# Patient Record
Sex: Female | Born: 1995 | Race: White | Hispanic: No | Marital: Married | Smoking: Never smoker
Health system: Southern US, Community
[De-identification: ages and names within clinical notes are randomized; demographics above are authoritative.]

## PROBLEM LIST (undated history)

## (undated) DIAGNOSIS — F419 Anxiety disorder, unspecified: Secondary | ICD-10-CM

## (undated) DIAGNOSIS — N200 Calculus of kidney: Secondary | ICD-10-CM

## (undated) DIAGNOSIS — F988 Other specified behavioral and emotional disorders with onset usually occurring in childhood and adolescence: Secondary | ICD-10-CM

## (undated) DIAGNOSIS — Z8619 Personal history of other infectious and parasitic diseases: Secondary | ICD-10-CM

## (undated) DIAGNOSIS — R87619 Unspecified abnormal cytological findings in specimens from cervix uteri: Secondary | ICD-10-CM

## (undated) HISTORY — DX: Personal history of other infectious and parasitic diseases: Z86.19

## (undated) HISTORY — DX: Calculus of kidney: N20.0

## (undated) HISTORY — PX: WISDOM TOOTH EXTRACTION: SHX21

## (undated) HISTORY — DX: Other specified behavioral and emotional disorders with onset usually occurring in childhood and adolescence: F98.8

## (undated) HISTORY — DX: Unspecified abnormal cytological findings in specimens from cervix uteri: R87.619

---
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
## 2014-12-09 ENCOUNTER — Other Ambulatory Visit: Payer: Self-pay | Admitting: Obstetrics & Gynecology

## 2014-12-09 NOTE — Telephone Encounter (Signed)
Needs appointment for annual examination.  I will refill one month of her birth control pills.

## 2014-12-09 NOTE — Telephone Encounter (Signed)
Patient called back and left a message on the answering machine at lunch. °

## 2014-12-09 NOTE — Telephone Encounter (Addendum)
Called patient she states she has not been taking Rx for the past 3 months and she wanted to get back on Rx. Inform pt she will need to be seen and also have her AEX. Pt states she is in school in [place] and does not know when she is coming back to town.   -Advised pt she can see somebody else in [place] and get her Rx refill there and call back here to schedule appt when she is ready in the summer.   Patient verbalized understanding.  She will go to the health center at her school.   Dr. Edward JollySilva approved 1 month. [facility] -Encounter closed.

## 2014-12-09 NOTE — Telephone Encounter (Signed)
Returned call. Phone number updated. °

## 2014-12-09 NOTE — Telephone Encounter (Signed)
Medication refill request: Yaz  Last AEX:  08/25/13 SM Next AEX: not scheduled  Last MMG (if hormonal medication request): none Refill authorized: 08/25/13 #4packs/4Refills/ Today #1pack with note "Will need OV for further refills"?  Attempted to call patient. Mailbox full. Unable to leave voicemail.   Routed to Dr. Edward JollySilva. Dr. Hyacinth MeekerMiller is out of the office today.

## 2015-01-09 ENCOUNTER — Other Ambulatory Visit: Payer: Self-pay | Admitting: Obstetrics and Gynecology

## 2015-01-11 NOTE — Telephone Encounter (Signed)
Medication refill request: Yaz Last AEX:  08/25/13 SM Next AEX: not scheduled  Last MMG (if hormonal medication request): none Refill authorized: 12/09/14 #1pack/0R.  Rx Denied.   Mackenzie SallesBrook E Amundson C Silva, MD at 12/09/2014 3:17 PM     Status: Signed       Expand All Collapse All   Needs appointment for annual examination.  I will refill one month of her birth control pills.

## 2018-01-02 ENCOUNTER — Encounter: Payer: Self-pay | Admitting: Obstetrics and Gynecology

## 2018-01-02 ENCOUNTER — Other Ambulatory Visit: Payer: Self-pay

## 2018-01-02 ENCOUNTER — Ambulatory Visit (INDEPENDENT_AMBULATORY_CARE_PROVIDER_SITE_OTHER): Payer: 59 | Admitting: Obstetrics and Gynecology

## 2018-01-02 ENCOUNTER — Other Ambulatory Visit (HOSPITAL_COMMUNITY)
Admission: RE | Admit: 2018-01-02 | Discharge: 2018-01-02 | Disposition: A | Discharge status: Home / Self Care | Payer: 59 | Source: Ambulatory Visit | Attending: Obstetrics and Gynecology | Admitting: Obstetrics and Gynecology

## 2018-01-02 VITALS — BP 120/70 | HR 80 | Resp 12 | Ht 63.25 in | Wt 127.0 lb

## 2018-01-02 DIAGNOSIS — F419 Anxiety disorder, unspecified: Secondary | ICD-10-CM | POA: Diagnosis not present

## 2018-01-02 DIAGNOSIS — Z0000 Encounter for general adult medical examination without abnormal findings: Secondary | ICD-10-CM

## 2018-01-02 DIAGNOSIS — R87611 Atypical squamous cells cannot exclude high grade squamous intraepithelial lesion on cytologic smear of cervix (ASC-H): Secondary | ICD-10-CM | POA: Insufficient documentation

## 2018-01-02 DIAGNOSIS — Z113 Encounter for screening for infections with a predominantly sexual mode of transmission: Secondary | ICD-10-CM | POA: Diagnosis not present

## 2018-01-02 DIAGNOSIS — Z01419 Encounter for gynecological examination (general) (routine) without abnormal findings: Secondary | ICD-10-CM

## 2018-01-02 DIAGNOSIS — Z124 Encounter for screening for malignant neoplasm of cervix: Secondary | ICD-10-CM | POA: Diagnosis present

## 2018-01-02 DIAGNOSIS — N920 Excessive and frequent menstruation with regular cycle: Secondary | ICD-10-CM

## 2018-01-02 DIAGNOSIS — N946 Dysmenorrhea, unspecified: Secondary | ICD-10-CM | POA: Diagnosis not present

## 2018-01-02 LAB — POCT URINE PREGNANCY: Preg Test, Ur: NEGATIVE

## 2018-01-02 MED ORDER — CITALOPRAM HYDROBROMIDE 20 MG PO TABS: ORAL_TABLET | ORAL | 1 refills | Status: DC

## 2018-01-02 MED ORDER — NORETHIN ACE-ETH ESTRAD-FE 1-20 MG-MCG PO TABS: 1.0000 | ORAL_TABLET | Freq: Every day | ORAL | 0 refills | Status: DC

## 2018-01-02 MED ORDER — NAPROXEN SODIUM 550 MG PO TABS: ORAL_TABLET | ORAL | 2 refills | Status: DC

## 2018-01-02 NOTE — Progress Notes (Signed)
22 y.o. G0P0000 SingleCaucasianF here for annual exam. Patient is c/o severe dysmenorrhea Period Cycle (Days): 28 Period Duration (Days): 7 days  Period Pattern: Regular Menstrual Flow: Heavy, Moderate Menstrual Control: Tampon, Maxi pad, Thin pad Menstrual Control Change Freq (Hours): changes pad and tampon every 1-2 hours on heavy days Dysmenorrhea: (!) Severe Dysmenorrhea Symptoms: Cramping, Nausea, Headache  Over the last 6+ months her cycles have gotten so painful and heavy. She gets headaches, nausea, diarrhea. Sometimes feels she is going to pass out because she feels so bad. She can saturate a super tampon in up to 1 hour. She bleeds through her pad at night.  She was on Yaz in the past.    Sexually active, same partner x 3 years. They are planning on getting married, trying to not be sexually active until they get married.  She has deep dyspareunia, almost every time, only lasts a few seconds, positional.   She c/o worsening anxiety. Can't be home alone. Occasional panic attacks.   Patient's last menstrual period was 12/31/2017.          Sexually active: Yes.    The current method of family planning is none.    Exercising: Yes.    circuits/ cardio  Smoker:  no  Health Maintenance: Pap:  Never TDaP: unsure, check records.  Gardasil: no   reports that she has never smoked. She has never used smokeless tobacco. She reports that she drinks about 1.8 oz of alcohol per week. She reports that she does not use drugs. Works as a Herbalist.   Past Medical History:  Diagnosis Date  . ADD (attention deficit disorder)   . History of mononucleosis 12/13    History reviewed. No pertinent surgical history.  No current outpatient medications on file.   No current facility-administered medications for this visit.     Family History  Problem Relation Age of Onset  . Multiple myeloma Maternal Grandmother   . Heart disease Maternal Grandfather        heart stents  . Graves'  disease Mother     Review of Systems  Constitutional: Negative.   HENT: Negative.   Eyes: Negative.   Respiratory: Negative.   Cardiovascular: Negative.   Gastrointestinal: Negative.   Endocrine: Negative.   Genitourinary: Positive for menstrual problem.       Dysmenorrhea with nausea  Musculoskeletal: Negative.   Skin: Negative.   Allergic/Immunologic: Negative.   Neurological: Positive for headaches.  Psychiatric/Behavioral: Negative.     Exam:   BP 120/70 (BP Location: Right Arm, Patient Position: Sitting, Cuff Size: Normal)   Pulse 80   Resp 12   Ht 5' 3.25" (1.607 m)   Wt 127 lb (57.6 kg)   LMP 12/31/2017   BMI 22.32 kg/m   Weight change: _0 @ Height:   Height: 5' 3.25" (160.7 cm)  Ht Readings from Last 3 Encounters:  01/02/18 5' 3.25" (1.607 m)  08/25/13 5' 3.5" (1.613 m) (38 %, Z= -0.30)*   * Growth percentiles are based on CDC (Girls, 2-20 Years) data.    General appearance: alert, cooperative and appears stated age Head: Normocephalic, without obvious abnormality, atraumatic Neck: no adenopathy, supple, symmetrical, trachea midline and thyroid normal to inspection and palpation Lungs: clear to auscultation bilaterally Cardiovascular: regular rate and rhythm Breasts: normal appearance, no masses or tenderness Abdomen: soft, non-tender; non distended,  no masses,  no organomegaly Extremities: extremities normal, atraumatic, no cyanosis or edema Skin: Skin color, texture, turgor normal. No rashes or lesions  Lymph nodes: Cervical, supraclavicular, and axillary nodes normal. No abnormal inguinal nodes palpated Neurologic: Grossly normal She thinks she has IBS, but her anxiety affects her as well.   Pelvic: External genitalia:  no lesions              Urethra:  normal appearing urethra with no masses, tenderness or lesions              Bartholins and Skenes: normal                 Vagina: normal appearing vagina with normal color and discharge, no  lesions              Cervix: no lesions               Bimanual Exam:  Uterus:  normal size, contour, position, consistency, mobility, non-tender, anteverted              Adnexa: no mass, fullness, tenderness               Rectovaginal: Confirms               Anus:  normal sphincter tone, no lesions  Chaperone was present for exam.  A:  Well Woman with normal exam  Menorrhagia  Severe dysmenorrhea  Anxiety, worsening    P:   Pap with hpv  Start OCP's, no contraindication, risks reviewed  Screening STD  Screening labs  Discussed breast self exam  Discussed calcium and vit D intake  Discussed options of counseling, or trying medication for anxiety, she would like to try medication.   She will check if she has had her TDAP  Information on gardasil given and reviewed

## 2018-01-02 NOTE — Patient Instructions (Addendum)
Check if you have had a TDAP (tetanus) in the last 10 years.  Breast Self-Awareness Breast self-awareness means being familiar with how your breasts look and feel. It involves checking your breasts regularly and reporting any changes to your health care provider. Practicing breast self-awareness is important. A change in your breasts can be a sign of a serious medical problem. Being familiar with how your breasts look and feel allows you to find any problems early, when treatment is more likely to be successful. All women should practice breast self-awareness, including women who have had breast implants. How to do a breast self-exam One way to learn what is normal for your breasts and whether your breasts are changing is to do a breast self-exam. To do a breast self-exam: Look for Changes  1. Remove all the clothing above your waist. 2. Stand in front of a mirror in a room with good lighting. 3. Put your hands on your hips. 4. Push your hands firmly downward. 5. Compare your breasts in the mirror. Look for differences between them (asymmetry), such as: ? Differences in shape. ? Differences in size. ? Puckers, dips, and bumps in one breast and not the other. 6. Look at each breast for changes in your skin, such as: ? Redness. ? Scaly areas. 7. Look for changes in your nipples, such as: ? Discharge. ? Bleeding. ? Dimpling. ? Redness. ? A change in position. Feel for Changes  Carefully feel your breasts for lumps and changes. It is best to do this while lying on your back on the floor and again while sitting or standing in the shower or tub with soapy water on your skin. Feel each breast in the following way:  Place the arm on the side of the breast you are examining above your head.  Feel your breast with the other hand.  Start in the nipple area and make  inch (2 cm) overlapping circles to feel your breast. Use the pads of your three middle fingers to do this. Apply light pressure,  then medium pressure, then firm pressure. The light pressure will allow you to feel the tissue closest to the skin. The medium pressure will allow you to feel the tissue that is a little deeper. The firm pressure will allow you to feel the tissue close to the ribs.  Continue the overlapping circles, moving downward over the breast until you feel your ribs below your breast.  Move one finger-width toward the center of the body. Continue to use the  inch (2 cm) overlapping circles to feel your breast as you move slowly up toward your collarbone.  Continue the up and down exam using all three pressures until you reach your armpit.  Write Down What You Find  Write down what is normal for each breast and any changes that you find. Keep a written record with breast changes or normal findings for each breast. By writing this information down, you do not need to depend only on memory for size, tenderness, or location. Write down where you are in your menstrual cycle, if you are still menstruating. If you are having trouble noticing differences in your breasts, do not get discouraged. With time you will become more familiar with the variations in your breasts and more comfortable with the exam. How often should I examine my breasts? Examine your breasts every month. If you are breastfeeding, the best time to examine your breasts is after a feeding or after using a breast pump. If  you menstruate, the best time to examine your breasts is 5-7 days after your period is over. During your period, your breasts are lumpier, and it may be more difficult to notice changes. When should I see my health care provider? See your health care provider if you notice:  A change in shape or size of your breasts or nipples.  A change in the skin of your breast or nipples, such as a reddened or scaly area.  Unusual discharge from your nipples.  A lump or thick area that was not there before.  Pain in your  breasts.  Anything that concerns you.  This information is not intended to replace advice given to you by your health care provider. Make sure you discuss any questions you have with your health care provider. Document Released: 08/28/2005 Document Revised: 02/03/2016 Document Reviewed: 07/18/2015 Elsevier Interactive Patient Education  2018 [employer]. Oral Contraception Use Oral contraceptive pills (OCPs) are medicines taken to prevent pregnancy. OCPs work by preventing the ovaries from releasing eggs. The hormones in OCPs also cause the cervical mucus to thicken, preventing the sperm from entering the uterus. The hormones also cause the uterine lining to become thin, not allowing a fertilized egg to attach to the inside of the uterus. OCPs are highly effective when taken exactly as prescribed. However, OCPs do not prevent sexually transmitted diseases (STDs). Safe sex practices, such as using condoms along with an OCP, can help prevent STDs. Before taking OCPs, you may have a physical exam and Pap test. Your health care provider may also order blood tests if necessary. Your health care provider will make sure you are a good candidate for oral contraception. Discuss with your health care provider the possible side effects of the OCP you may be prescribed. When starting an OCP, it can take 2 to 3 months for the body to adjust to the changes in hormone levels in your body. How to take oral contraceptive pills Your health care provider may advise you on how to start taking the first cycle of OCPs. Otherwise, you can:  Start on day 1 of your menstrual period. You will not need any backup contraceptive protection with this start time.  Start on the first Sunday after your menstrual period or the day you get your prescription. In these cases, you will need to use backup contraceptive protection for the first week.  Start the pill at any time of your cycle. If you take the pill within 5 days of the  start of your period, you are protected against pregnancy right away. In this case, you will not need a backup form of birth control. If you start at any other time of your menstrual cycle, you will need to use another form of birth control for 7 days. If your OCP is the type called a minipill, it will protect you from pregnancy after taking it for 2 days (48 hours).  After you have started taking OCPs:  If you forget to take 1 pill, take it as soon as you remember. Take the next pill at the regular time.  If you miss 2 or more pills, call your health care provider because different pills have different instructions for missed doses. Use backup birth control until your next menstrual period starts.  If you use a 28-day pack that contains inactive pills and you miss 1 of the last 7 pills (pills with no hormones), it will not matter. Throw away the rest of the non-hormone pills and  start a new pill pack.  No matter which day you start the OCP, you will always start a new pack on that same day of the week. Have an extra pack of OCPs and a backup contraceptive method available in case you miss some pills or lose your OCP pack. Follow these instructions at home:  Do not smoke.  Always use a condom to protect against STDs. OCPs do not protect against STDs.  Use a calendar to mark your menstrual period days.  Read the information and directions that came with your OCP. Talk to your health care provider if you have questions. Contact a health care provider if:  You develop nausea and vomiting.  You have abnormal vaginal discharge or bleeding.  You develop a rash.  You miss your menstrual period.  You are losing your hair.  You need treatment for mood swings or depression.  You get dizzy when taking the OCP.  You develop acne from taking the OCP.  You become pregnant. Get help right away if:  You develop chest pain.  You develop shortness of breath.  You have an uncontrolled or  severe headache.  You develop numbness or slurred speech.  You develop visual problems.  You develop pain, redness, and swelling in the legs. This information is not intended to replace advice given to you by your health care provider. Make sure you discuss any questions you have with your health care provider. Document Released: 08/17/2011 Document Revised: 02/03/2016 Document Reviewed: 02/16/2013 Elsevier Interactive Patient Education  2017 [employer]

## 2018-01-03 LAB — HEP, RPR, HIV PANEL
HIV Screen 4th Generation wRfx: NONREACTIVE
Hepatitis B Surface Ag: NEGATIVE
RPR Ser Ql: NONREACTIVE

## 2018-01-03 LAB — CBC
Hematocrit: 39.5 % (ref 34.0–46.6)
Hemoglobin: 13.1 g/dL (ref 11.1–15.9)
MCH: 30.9 pg (ref 26.6–33.0)
MCHC: 33.2 g/dL (ref 31.5–35.7)
MCV: 93 fL (ref 79–97)
PLATELETS: 222 10*3/uL (ref 150–379)
RBC: 4.24 x10E6/uL (ref 3.77–5.28)
RDW: 13.2 % (ref 12.3–15.4)
WBC: 4.8 10*3/uL (ref 3.4–10.8)

## 2018-01-03 LAB — COMPREHENSIVE METABOLIC PANEL
A/G RATIO: 2 (ref 1.2–2.2)
ALT: 8 IU/L (ref 0–32)
AST: 17 IU/L (ref 0–40)
Albumin: 4.8 g/dL (ref 3.5–5.5)
Alkaline Phosphatase: 46 IU/L (ref 39–117)
BUN/Creatinine Ratio: 8 — ABNORMAL LOW (ref 9–23)
BUN: 7 mg/dL (ref 6–20)
Bilirubin Total: 0.5 mg/dL (ref 0.0–1.2)
CALCIUM: 9.4 mg/dL (ref 8.7–10.2)
CHLORIDE: 104 mmol/L (ref 96–106)
CO2: 24 mmol/L (ref 20–29)
Creatinine, Ser: 0.92 mg/dL (ref 0.57–1.00)
GFR calc Af Amer: 101 mL/min/{1.73_m2} (ref >59)
GFR, EST NON AFRICAN AMERICAN: 88 mL/min/{1.73_m2} (ref >59)
Globulin, Total: 2.4 g/dL (ref 1.5–4.5)
Glucose: 98 mg/dL (ref 65–99)
POTASSIUM: 3.8 mmol/L (ref 3.5–5.2)
Sodium: 139 mmol/L (ref 134–144)
Total Protein: 7.2 g/dL (ref 6.0–8.5)

## 2018-01-03 LAB — LIPID PANEL
CHOL/HDL RATIO: 2.8 ratio (ref 0.0–4.4)
Cholesterol, Total: 176 mg/dL (ref 100–199)
HDL: 63 mg/dL (ref >39)
LDL Calculated: 98 mg/dL (ref 0–99)
TRIGLYCERIDES: 76 mg/dL (ref 0–149)
VLDL Cholesterol Cal: 15 mg/dL (ref 5–40)

## 2018-01-03 LAB — TSH: TSH: 1.46 u[IU]/mL (ref 0.450–4.500)

## 2018-01-04 ENCOUNTER — Telehealth: Payer: Self-pay | Admitting: Obstetrics and Gynecology

## 2018-01-04 NOTE — Telephone Encounter (Signed)
Patient would like a call about her pap results and not get results sent by mail.

## 2018-01-04 NOTE — Telephone Encounter (Signed)
Spoke with patient. Advised pap dated 01/02/18 not back yet, can take up to 7 days. Our office will return call to notify of results once reviewed by Dr. Oscar LaJertson. Patient thankful and verbalizes understanding.   Routing to provider for final review. Patient is agreeable to disposition. Will close encounter.

## 2018-01-09 LAB — CYTOLOGY - PAP
CHLAMYDIA, DNA PROBE: NEGATIVE
NEISSERIA GONORRHEA: NEGATIVE

## 2018-01-11 ENCOUNTER — Telehealth: Payer: Self-pay

## 2018-01-11 DIAGNOSIS — R87611 Atypical squamous cells cannot exclude high grade squamous intraepithelial lesion on cytologic smear of cervix (ASC-H): Secondary | ICD-10-CM

## 2018-01-11 NOTE — Telephone Encounter (Signed)
-----   Message from Romualdo Bolk, MD sent at 01/09/2018  8:42 PM EDT ----- Please inform the patient and set her up for a colposcopy.

## 2018-01-11 NOTE — Telephone Encounter (Signed)
Left message to call Loraine Bhullar at [phone]. 

## 2018-01-14 NOTE — Telephone Encounter (Signed)
Spoke with patient and reviewed pap smear results. Advised that she will need further testing by means of colposcopy and will have Mackenzie Schroeder call to schedule.

## 2018-01-14 NOTE — Telephone Encounter (Signed)
Left message to call Noreene Larsson at [phone].   Order placed for colposcopy

## 2018-01-15 NOTE — Telephone Encounter (Signed)
Spoke with patient, advised of all results dated 01/02/18 per Dr. Oscar La.   LMP 12/31/17, OCP for contraceptive.   Patient previously scheduled for med recheck on 5/28, requesting colpo on this day if possible.   Colpo scheduled for 02/05/18 at 1pm with Dr. Oscar La, can have med recheck same day. Return call to office prior to appointment if menses starts and bleeding is heavy, or any chance of pregnancy. Advised to take Motrin 800 mg with food and water one hour before procedure.  Routing to provider for final review. Patient is agreeable to disposition. Will close encounter.   Cc: Soundra Pilon

## 2018-01-15 NOTE — Telephone Encounter (Signed)
Left message to call Zoriah Pulice at [phone].  

## 2018-01-15 NOTE — Telephone Encounter (Signed)
Patient is calling to see if her other STD testing results are in yet.

## 2018-01-31 ENCOUNTER — Telehealth: Payer: Self-pay

## 2018-01-31 NOTE — Telephone Encounter (Signed)
Left message to call Mackenzie Schroeder at [phone].  Need to reschedule patient's colposcopy to today or next Wednesday 5/29 due to possible daily in surgery on 5/28.

## 2018-01-31 NOTE — Progress Notes (Deleted)
GYNECOLOGY  VISIT   HPI: 22 y.o.   Single  Caucasian  female   G0P0000 with No LMP recorded.   here for colposcopy and medication check.    GYNECOLOGIC HISTORY: No LMP recorded. Contraception:***?OCPs Menopausal hormone therapy: n/a        OB History    Gravida  0   Para  0   Term  0   Preterm  0   AB  0   Living  0     SAB  0   TAB  0   Ectopic  0   Multiple  0   Live Births                 There are no active problems to display for this patient.   Past Medical History:  Diagnosis Date  . ADD (attention deficit disorder)   . History of mononucleosis 12/13    No past surgical history on file.  Current Outpatient Medications  Medication Sig Dispense Refill  . citalopram (CELEXA) 20 MG tablet Take 1/2 a tablet po qd for one week, if tolerating, then increase to one tablet a day. 30 tablet 1  . naproxen sodium (ANAPROX DS) 550 MG tablet 1 tablet po q 12 hours prn pain 30 tablet 2  . norethindrone-ethinyl estradiol (JUNEL FE,GILDESS FE,LOESTRIN FE) 1-20 MG-MCG tablet Take 1 tablet by mouth daily. 3 Package 0   No current facility-administered medications for this visit.      ALLERGIES: Patient has no known allergies.  Family History  Problem Relation Age of Onset  . Multiple myeloma Maternal Grandmother   . Heart disease Maternal Grandfather        heart stents  . Graves' disease Mother     Social History   Socioeconomic History  . Marital status: Single    Spouse name: Not on file  . Number of children: Not on file  . Years of education: Not on file  . Highest education level: Not on file  Occupational History  . Not on file  Social Needs  . Financial resource strain: Not on file  . Food insecurity:    Worry: Not on file    Inability: Not on file  . Transportation needs:    Medical: Not on file    Non-medical: Not on file  Tobacco Use  . Smoking status: Never Smoker  . Smokeless tobacco: Never Used  Substance and Sexual  Activity  . Alcohol use: Yes    Alcohol/week: 1.8 oz    Types: 3 Standard drinks or equivalent per week  . Drug use: No  . Sexual activity: Yes    Partners: Male    Birth control/protection: None  Lifestyle  . Physical activity:    Days per week: Not on file    Minutes per session: Not on file  . Stress: Not on file  Relationships  . Social connections:    Talks on phone: Not on file    Gets together: Not on file    Attends religious service: Not on file    Active member of club or organization: Not on file    Attends meetings of clubs or organizations: Not on file    Relationship status: Not on file  . Intimate partner violence:    Fear of current or ex partner: Not on file    Emotionally abused: Not on file    Physically abused: Not on file    Forced sexual activity: Not on file  Other Topics Concern  . Not on file  Social History Narrative  . Not on file    ROS  PHYSICAL EXAMINATION:    There were no vitals taken for this visit.    General appearance: alert, cooperative and appears stated age Neck: no adenopathy, supple, symmetrical, trachea midline and thyroid {CHL AMB PHY EX THYROID NORM DEFAULT:[id number]::"normal to inspection and palpation"} Breasts: {Exam; breast:13139::"normal appearance, no masses or tenderness"} Abdomen: soft, non-tender; non distended, no masses,  no organomegaly  Pelvic: External genitalia:  no lesions              Urethra:  normal appearing urethra with no masses, tenderness or lesions              Bartholins and Skenes: normal                 Vagina: normal appearing vagina with normal color and discharge, no lesions              Cervix: {CHL AMB PHY EX CERVIX NORM DEFAULT:[phone]::"no lesions"}              Bimanual Exam:  Uterus:  {CHL AMB PHY EX UTERUS NORM DEFAULT:[id number]::"normal size, contour, position, consistency, mobility, non-tender"}              Adnexa: {CHL AMB PHY EX ADNEXA NO MASS [phone]::"no mass,  fullness, tenderness"}              Rectovaginal: {yes no:[id number]}.  Confirms.              Anus:  normal sphincter tone, no lesions  Chaperone was present for exam.  ASSESSMENT     PLAN    An After Visit Summary was printed and given to the patient.  *** minutes face to face time of which over 50% was spent in counseling.

## 2018-02-01 NOTE — Telephone Encounter (Signed)
Patient is returning a call to Kaitlyn. She will not be available between 10am-2pm today. °

## 2018-02-01 NOTE — Telephone Encounter (Signed)
Spoke with patient. Colposcopy appointment moved to 5/29 at 2:30 pm with Dr.Jertson. Patient is agreeable to date and time.  Routing to provider for final review. Patient agreeable to disposition. Will close encounter.

## 2018-02-01 NOTE — Telephone Encounter (Signed)
Patient is returning a call to Ryan. She will not be available between 10am-2pm today.

## 2018-02-05 ENCOUNTER — Ambulatory Visit: Payer: Self-pay | Admitting: Obstetrics and Gynecology

## 2018-02-05 ENCOUNTER — Telehealth: Payer: Self-pay | Admitting: Obstetrics and Gynecology

## 2018-02-05 ENCOUNTER — Ambulatory Visit: Payer: 59 | Admitting: Obstetrics and Gynecology

## 2018-02-05 NOTE — Telephone Encounter (Signed)
Call placed to patient to review benefits for a procedure scheduled on 02/06/18.  Left voicemail message requesting a return call.

## 2018-02-06 ENCOUNTER — Other Ambulatory Visit: Payer: Self-pay

## 2018-02-06 ENCOUNTER — Encounter: Payer: Self-pay | Admitting: Obstetrics and Gynecology

## 2018-02-06 ENCOUNTER — Ambulatory Visit (INDEPENDENT_AMBULATORY_CARE_PROVIDER_SITE_OTHER): Payer: 59 | Admitting: Obstetrics and Gynecology

## 2018-02-06 VITALS — BP 116/64 | HR 90 | Resp 14 | Ht 62.0 in | Wt 127.4 lb

## 2018-02-06 DIAGNOSIS — R87611 Atypical squamous cells cannot exclude high grade squamous intraepithelial lesion on cytologic smear of cervix (ASC-H): Secondary | ICD-10-CM | POA: Diagnosis not present

## 2018-02-06 DIAGNOSIS — Z01812 Encounter for preprocedural laboratory examination: Secondary | ICD-10-CM | POA: Diagnosis not present

## 2018-02-06 LAB — POCT URINE PREGNANCY: Preg Test, Ur: NEGATIVE

## 2018-02-06 NOTE — Patient Instructions (Signed)

## 2018-02-06 NOTE — Progress Notes (Signed)
GYNECOLOGY  VISIT   HPI: 22 y.o.   Single  Caucasian  female   G0P0000 with Patient's last menstrual period was 01/23/2018.   here for evaluation an [facility] pap smear  GYNECOLOGIC HISTORY: Patient's last menstrual period was 01/23/2018. Contraception: OCP Menopausal hormone therapy: none         OB History    Gravida  0   Para  0   Term  0   Preterm  0   AB  0   Living  0     SAB  0   TAB  0   Ectopic  0   Multiple  0   Live Births                 There are no active problems to display for this patient.   Past Medical History:  Diagnosis Date  . ADD (attention deficit disorder)   . History of mononucleosis 12/13    History reviewed. No pertinent surgical history.  Current Outpatient Medications  Medication Sig Dispense Refill  . citalopram (CELEXA) 20 MG tablet Take 1/2 a tablet po qd for one week, if tolerating, then increase to one tablet a day. 30 tablet 1  . naproxen sodium (ANAPROX DS) 550 MG tablet 1 tablet po q 12 hours prn pain 30 tablet 2  . norethindrone-ethinyl estradiol (JUNEL FE,GILDESS FE,LOESTRIN FE) 1-20 MG-MCG tablet Take 1 tablet by mouth daily. 3 Package 0   No current facility-administered medications for this visit.      ALLERGIES: Patient has no known allergies.  Family History  Problem Relation Age of Onset  . Multiple myeloma Maternal Grandmother   . Heart disease Maternal Grandfather        heart stents  . Graves' disease Mother     Social History   Socioeconomic History  . Marital status: Single    Spouse name: Not on file  . Number of children: Not on file  . Years of education: Not on file  . Highest education level: Not on file  Occupational History  . Not on file  Social Needs  . Financial resource strain: Not on file  . Food insecurity:    Worry: Not on file    Inability: Not on file  . Transportation needs:    Medical: Not on file    Non-medical: Not on file  Tobacco Use  . Smoking status: Never  Smoker  . Smokeless tobacco: Never Used  Substance and Sexual Activity  . Alcohol use: Yes    Alcohol/week: 1.8 oz    Types: 3 Standard drinks or equivalent per week  . Drug use: No  . Sexual activity: Yes    Partners: Male    Birth control/protection: Pill  Lifestyle  . Physical activity:    Days per week: Not on file    Minutes per session: Not on file  . Stress: Not on file  Relationships  . Social connections:    Talks on phone: Not on file    Gets together: Not on file    Attends religious service: Not on file    Active member of club or organization: Not on file    Attends meetings of clubs or organizations: Not on file    Relationship status: Not on file  . Intimate partner violence:    Fear of current or ex partner: Not on file    Emotionally abused: Not on file    Physically abused: Not on file  Forced sexual activity: Not on file  Other Topics Concern  . Not on file  Social History Narrative  . Not on file    Review of Systems  Constitutional: Negative.   HENT: Negative.   Eyes: Negative.   Respiratory: Negative.   Cardiovascular: Negative.   Gastrointestinal: Negative.   Genitourinary: Negative.   Musculoskeletal: Negative.   Skin: Negative.   Neurological: Negative.   Endo/Heme/Allergies: Negative.   Psychiatric/Behavioral: Negative.     PHYSICAL EXAMINATION:    BP 116/64 (BP Location: Right Arm, Patient Position: Sitting, Cuff Size: Normal)   Pulse 90   Resp 14   Ht _0  (1.575 m)   Wt 127 lb 6.4 oz (57.8 kg)   LMP 01/23/2018   BMI 23.30 kg/m     General appearance: alert, cooperative and appears stated age   Pelvic: External genitalia:  no lesions              Urethra:  normal appearing urethra with no masses, tenderness or lesions              Bartholins and Skenes: normal                 Vagina: normal appearing vagina with normal color and discharge, no lesions              Cervix: no gross lesions  Colposcopy: not satisfactory,  aceto-white changes at 7 o'clock and 12 o'clock, 12 o'clock with mild punctation, biopsies taken at both areas. ECC done. Biopsy sights treated with silver nitrate and monsels.  Negative lugols examination of the upper vagina.   Chaperone was present for exam.  ASSESSMENT [facility], discussed abnormal paps, cervical dysplasia, possible need for treatment    PLAN Colposcopy with biopsies and ECC done Further plan depending on results.  The patient had a hard time with the procedure, if she does need a leep I would consider doing it in the OR   An After Visit Summary was printed and given to the patient.

## 2018-02-12 ENCOUNTER — Telehealth: Payer: Self-pay | Admitting: Obstetrics and Gynecology

## 2018-02-12 NOTE — Telephone Encounter (Signed)
Spoke with patient. Reports intermittent cramping and spotting since colpo on 5/29. Describes odor as "old blood smell".  Taking anaprox prn for cramping, pain currently 0/10. Denies fever/chills, N/V. Patient states she was very active on Sunday.   Advised not uncommon to experience cramping and spotting following colpo.   Advised patient to continue to monitor, would need to be seen in office if heavy bleeding develops, fever/chills, or foul odor develops.   Offered OV, patient declined. Patient request to review lab results. Advised as seen below per Dr. Oscar LaJertson.   LMP 01/23/18, OCP for contraceptive. Brief explanation of LEEP procedure provided. Patient request to review option of LEEP in OR, previously discussed with Dr. Oscar LaJertson. Advised I will review with Dr. Oscar LaJertson and return call. Patient agreeable.   Dr. Oscar LaJertson -please review and advise?

## 2018-02-12 NOTE — Telephone Encounter (Signed)
Patient called and left a message on our answering machine early this morning. She said she's having an odor with pain and bleeding that's still continuing since having her colposcopy on 02/06/18.  Last seen: 02/06/18

## 2018-02-12 NOTE — Telephone Encounter (Signed)
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 She needs a leep, please set this up.

## 2018-02-12 NOTE — Telephone Encounter (Signed)
Will plan leep in the OR.  CC: Raynelle JanYeakley

## 2018-02-14 ENCOUNTER — Ambulatory Visit: Payer: 59 | Admitting: Obstetrics and Gynecology

## 2018-02-14 ENCOUNTER — Telehealth: Payer: Self-pay | Admitting: Obstetrics and Gynecology

## 2018-02-14 ENCOUNTER — Encounter: Payer: Self-pay | Admitting: Obstetrics and Gynecology

## 2018-02-14 ENCOUNTER — Other Ambulatory Visit: Payer: Self-pay

## 2018-02-14 VITALS — BP 104/68 | HR 78 | Temp 98.2°F | Resp 14 | Ht 62.0 in | Wt 126.0 lb

## 2018-02-14 DIAGNOSIS — R102 Pelvic and perineal pain: Secondary | ICD-10-CM | POA: Diagnosis not present

## 2018-02-14 DIAGNOSIS — D069 Carcinoma in situ of cervix, unspecified: Secondary | ICD-10-CM | POA: Diagnosis not present

## 2018-02-14 NOTE — Telephone Encounter (Signed)
Spoke with patient. Requesting OV for pelvic pain that has not resolved since colpo on 02/06/18. Describes sharp, intermittent, shooting low pelvic pain, currently 4/10. No heavy bleeding, still spotting.  Denies fever/chills. N/V or vaginal odor.   OV scheduled for today at 1:15pm with Dr. Oscar LaJertson.   Routing to provider for final review. Patient is agreeable to disposition. Will close encounter.  Cc: Harland DingwallSuzy Dixon, Billie RuddySally Yeakley, RN

## 2018-02-14 NOTE — Progress Notes (Signed)
GYNECOLOGY  VISIT   HPI: 22 y.o.   Single  Caucasian  female   G0P0000 with Patient's last menstrual period was 01/23/2018.here for lower pelvic pain since colposcopy 02-06-18. 2 biopsies and an ECC were done. Pathology retuned with CIN III. She has had intermittent sharp cramps since the colposcopy. She wakes up with cramps, takes anaprox, the pain goes away, then in the evening it returns. She was having brown spotting to light red bleeding since the procedure. The cramps can last for 10 minutes, feels similar to her menstrual cramps. No fevers, no current odor.  Negative Genprobe in 4/19. Same partner. Didn't take anaprox this morning.   GYNECOLOGIC HISTORY: Patient's last menstrual period was 01/23/2018. Contraception:OCPs Menopausal hormone therapy: n/a        OB History    Gravida  0   Para  0   Term  0   Preterm  0   AB  0   Living  0     SAB  0   TAB  0   Ectopic  0   Multiple  0   Live Births                 There are no active problems to display for this patient.   Past Medical History:  Diagnosis Date  . ADD (attention deficit disorder)   . History of mononucleosis 12/13    History reviewed. No pertinent surgical history.  Current Outpatient Medications  Medication Sig Dispense Refill  . citalopram (CELEXA) 20 MG tablet Take 1/2 a tablet po qd for one week, if tolerating, then increase to one tablet a day. 30 tablet 1  . naproxen sodium (ANAPROX DS) 550 MG tablet 1 tablet po q 12 hours prn pain 30 tablet 2  . norethindrone-ethinyl estradiol (JUNEL FE,GILDESS FE,LOESTRIN FE) 1-20 MG-MCG tablet Take 1 tablet by mouth daily. 3 Package 0   No current facility-administered medications for this visit.      ALLERGIES: Patient has no known allergies.  Family History  Problem Relation Age of Onset  . Multiple myeloma Maternal Grandmother   . Heart disease Maternal Grandfather        heart stents  . Graves' disease Mother     Social History    Socioeconomic History  . Marital status: Single    Spouse name: Not on file  . Number of children: Not on file  . Years of education: Not on file  . Highest education level: Not on file  Occupational History  . Not on file  Social Needs  . Financial resource strain: Not on file  . Food insecurity:    Worry: Not on file    Inability: Not on file  . Transportation needs:    Medical: Not on file    Non-medical: Not on file  Tobacco Use  . Smoking status: Never Smoker  . Smokeless tobacco: Never Used  Substance and Sexual Activity  . Alcohol use: Yes    Alcohol/week: 1.8 oz    Types: 3 Standard drinks or equivalent per week  . Drug use: No  . Sexual activity: Yes    Partners: Male    Birth control/protection: Pill  Lifestyle  . Physical activity:    Days per week: Not on file    Minutes per session: Not on file  . Stress: Not on file  Relationships  . Social connections:    Talks on phone: Not on file    Gets together: Not on  file    Attends religious service: Not on file    Active member of club or organization: Not on file    Attends meetings of clubs or organizations: Not on file    Relationship status: Not on file  . Intimate partner violence:    Fear of current or ex partner: Not on file    Emotionally abused: Not on file    Physically abused: Not on file    Forced sexual activity: Not on file  Other Topics Concern  . Not on file  Social History Narrative  . Not on file    Review of Systems  Constitutional: Negative.   HENT: Negative.   Eyes: Negative.   Respiratory: Negative.   Cardiovascular: Negative.   Gastrointestinal: Negative.   Genitourinary:       Pelvic pain  Musculoskeletal: Negative.   Skin: Negative.   Neurological: Negative.   Endo/Heme/Allergies: Negative.   Psychiatric/Behavioral: Negative.     PHYSICAL EXAMINATION:    BP 104/68 (BP Location: Right Arm, Patient Position: Sitting, Cuff Size: Normal)   Pulse 78   Resp 14   Ht  5' 2"  (1.575 m)   Wt 126 lb (57.2 kg)   LMP 01/23/2018   BMI 23.05 kg/m     T 98.2 General appearance: alert, cooperative and appears stated age Abdomen: soft, non-tender; non distended, no masses,  no organomegaly  Pelvic: External genitalia:  no lesions              Urethra:  normal appearing urethra with no masses, tenderness or lesions              Bartholins and Skenes: normal                 Vagina: normal appearing vagina with normal color and discharge, no lesions              Cervix: no lesions and healing well. No cervical motion tenderness.              Bimanual Exam:  Uterus:  normal size, contour, position, consistency, mobility, non-tender              Adnexa: no mass, fullness, tenderness                Chaperone was present for exam.  ASSESSMENT Pain s/p colposcopy, normal exam, no signs of infection CIN III    PLAN Patient reassured Take anaprox as needed  She needs a LEEP, prefers to do it in the OR. Working on getting this scheduled.    An After Visit Summary was printed and given to the patient.

## 2018-02-14 NOTE — Telephone Encounter (Signed)
Spoke with patient regarding benefit for surgery. Patient understood and agreeable. Patient has confirmed and is ready to proceed with scheduling. Patient aware this is professional benefit only. Patient aware will be contacted by hospital for separate benefits. Forwarding to the Nurse Supervisor for scheduling.  Routing to Billie RuddySally Yeakley, RN

## 2018-02-14 NOTE — Telephone Encounter (Signed)
Patient left voicemail on machine early this morning stating that she is still in pain and believes she needs to be seen.

## 2018-02-18 ENCOUNTER — Other Ambulatory Visit: Payer: Self-pay

## 2018-02-18 ENCOUNTER — Encounter (HOSPITAL_COMMUNITY): Payer: Self-pay | Admitting: *Deleted

## 2018-02-18 NOTE — Telephone Encounter (Signed)
Call from patient. Requests to schedule procedure. On menses now. On birth control pills for contraception.  Requests procedure date of 03-05-18.  Surgery instruction sheet reviewed and printed copy will be provided at consult appointment on 02-21-18.   Routing to provider for review. Will close encounter.

## 2018-02-21 ENCOUNTER — Encounter: Payer: Self-pay | Admitting: Obstetrics and Gynecology

## 2018-02-21 ENCOUNTER — Other Ambulatory Visit: Payer: Self-pay

## 2018-02-21 ENCOUNTER — Ambulatory Visit (INDEPENDENT_AMBULATORY_CARE_PROVIDER_SITE_OTHER): Payer: 59 | Admitting: Obstetrics and Gynecology

## 2018-02-21 VITALS — BP 114/70 | HR 68 | Resp 14 | Wt 126.0 lb

## 2018-02-21 DIAGNOSIS — D069 Carcinoma in situ of cervix, unspecified: Secondary | ICD-10-CM

## 2018-02-21 DIAGNOSIS — Z23 Encounter for immunization: Secondary | ICD-10-CM

## 2018-02-21 DIAGNOSIS — Z7185 Encounter for immunization safety counseling: Secondary | ICD-10-CM

## 2018-02-21 DIAGNOSIS — F419 Anxiety disorder, unspecified: Secondary | ICD-10-CM

## 2018-02-21 DIAGNOSIS — Z7189 Other specified counseling: Secondary | ICD-10-CM | POA: Diagnosis not present

## 2018-02-21 MED ORDER — CITALOPRAM HYDROBROMIDE 20 MG PO TABS: ORAL_TABLET | ORAL | 3 refills | Status: DC

## 2018-02-21 NOTE — H&P (Signed)
GYNECOLOGY  VISIT   HPI: 22 y.o.   Single  Caucasian  female   G0P0000 with Patient's last menstrual period was 01/23/2018.   here to discuss surgery, also needs to f/u on treatment of anxiety.  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 She had a very hard time with the colposcopy. Colposcopy was not satisfactory, cervical biopsies returned with CIN II-III, ECC with atypical cells concerning for dysplasia.  She struggles with anxiety and was started on Celexa at the end of April. She is doing well on it. No side effects. Anxiety is much better, now tolerable.   GYNECOLOGIC HISTORY: Patient's last menstrual period was 01/23/2018. Contraception:OCP's Menopausal hormone therapy: NA        OB History    Gravida  0   Para  0   Term  0   Preterm  0   AB  0   Living  0     SAB  0   TAB  0   Ectopic  0   Multiple  0   Live Births                 There are no active problems to display for this patient.   Past Medical History:  Diagnosis Date  . ADD (attention deficit disorder)    no meds currently  . Anxiety   . History of mononucleosis 12/13    Past Surgical History:  Procedure Laterality Date  . WISDOM TOOTH EXTRACTION      Current Outpatient Medications  Medication Sig Dispense Refill  . citalopram (CELEXA) 20 MG tablet Take 1/2 a tablet po qd for one week, if tolerating, then increase to one tablet a day. 30 tablet 1  . naproxen sodium (ANAPROX DS) 550 MG tablet 1 tablet po q 12 hours prn pain 30 tablet 2  . norethindrone-ethinyl estradiol (JUNEL FE,GILDESS FE,LOESTRIN FE) 1-20 MG-MCG tablet Take 1 tablet by mouth daily. 3 Package 0   No current facility-administered medications for this visit.      ALLERGIES: Patient has no known allergies.  Family History  Problem Relation Age of Onset  . Multiple myeloma Maternal Grandmother   . Heart disease Maternal Grandfather        heart stents  . Graves' disease  Mother     Social History   Socioeconomic History  . Marital status: Single    Spouse name: Not on file  . Number of children: Not on file  . Years of education: Not on file  . Highest education level: Not on file  Occupational History  . Not on file  Social Needs  . Financial resource strain: Not on file  . Food insecurity:    Worry: Not on file    Inability: Not on file  . Transportation needs:    Medical: Not on file    Non-medical: Not on file  Tobacco Use  . Smoking status: Never Smoker  . Smokeless tobacco: Never Used  Substance and Sexual Activity  . Alcohol use: Yes    Alcohol/week: 1.8 - 3.0 oz    Types: 3 - 5 Glasses of wine per week  . Drug use: No  . Sexual activity: Yes    Partners: Male    Birth control/protection: Pill  Lifestyle  . Physical activity:    Days per week: Not on file    Minutes per session: Not on file  . Stress: Not on file    Relationships  . Social connections:    Talks on phone: Not on file    Gets together: Not on file    Attends religious service: Not on file    Active member of club or organization: Not on file    Attends meetings of clubs or organizations: Not on file    Relationship status: Not on file  . Intimate partner violence:    Fear of current or ex partner: Not on file    Emotionally abused: Not on file    Physically abused: Not on file    Forced sexual activity: Not on file  Other Topics Concern  . Not on file  Social History Narrative  . Not on file    Review of Systems  Constitutional: Negative.   HENT: Negative.   Eyes: Negative.   Respiratory: Negative.   Cardiovascular: Negative.   Gastrointestinal: Negative.   Genitourinary: Negative.   Musculoskeletal: Negative.   Skin: Negative.   Neurological: Negative.   Endo/Heme/Allergies: Negative.   Psychiatric/Behavioral: Negative.     PHYSICAL EXAMINATION:    LMP 01/23/2018     General appearance: alert, cooperative and appears stated age Neck: no  adenopathy, supple, symmetrical, trachea midline and thyroid normal to inspection and palpation Heart: regular rate and rhythm Lungs: CTAB Abdomen: soft, non-tender; bowel sounds normal; no masses,  no organomegaly Extremities: normal, atraumatic, no cyanosis Skin: normal color, texture and turgor, no rashes or lesions Lymph: normal cervical supraclavicular and inguinal nodes Neurologic: grossly normal   ASSESSMENT High grade cervical dysplasia Anxiety, much improved on Celexa    PLAN Loop cone cervical biopsy with ECC in the OR The patient was counseled as to the risks of the procedure, including: infection, bleeding, future pregnancy risks and cervical stenosis.  Continue Celexa Start Gardasil series now   An After Visit Summary was printed and given to the patient.     

## 2018-02-21 NOTE — Progress Notes (Signed)
GYNECOLOGY  VISIT   HPI: 22 y.o.   Single  Caucasian  female   G0P0000 with Patient's last menstrual period was 01/23/2018.   here to discuss surgery, also needs to f/u on treatment of anxiety.  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 She had a very hard time with the colposcopy. Colposcopy was not satisfactory, cervical biopsies returned with CIN II-III, ECC with atypical cells concerning for dysplasia.  She struggles with anxiety and was started on Celexa at the end of April. She is doing well on it. No side effects. Anxiety is much better, now tolerable.   GYNECOLOGIC HISTORY: Patient's last menstrual period was 01/23/2018. Contraception:OCP's Menopausal hormone therapy: NA        OB History    Gravida  0   Para  0   Term  0   Preterm  0   AB  0   Living  0     SAB  0   TAB  0   Ectopic  0   Multiple  0   Live Births                 There are no active problems to display for this patient.   Past Medical History:  Diagnosis Date  . ADD (attention deficit disorder)    no meds currently  . Anxiety   . History of mononucleosis 12/13    Past Surgical History:  Procedure Laterality Date  . WISDOM TOOTH EXTRACTION      Current Outpatient Medications  Medication Sig Dispense Refill  . citalopram (CELEXA) 20 MG tablet Take 1/2 a tablet po qd for one week, if tolerating, then increase to one tablet a day. 30 tablet 1  . naproxen sodium (ANAPROX DS) 550 MG tablet 1 tablet po q 12 hours prn pain 30 tablet 2  . norethindrone-ethinyl estradiol (JUNEL FE,GILDESS FE,LOESTRIN FE) 1-20 MG-MCG tablet Take 1 tablet by mouth daily. 3 Package 0   No current facility-administered medications for this visit.      ALLERGIES: Patient has no known allergies.  Family History  Problem Relation Age of Onset  . Multiple myeloma Maternal Grandmother   . Heart disease Maternal Grandfather        heart stents  . Graves' disease  Mother     Social History   Socioeconomic History  . Marital status: Single    Spouse name: Not on file  . Number of children: Not on file  . Years of education: Not on file  . Highest education level: Not on file  Occupational History  . Not on file  Social Needs  . Financial resource strain: Not on file  . Food insecurity:    Worry: Not on file    Inability: Not on file  . Transportation needs:    Medical: Not on file    Non-medical: Not on file  Tobacco Use  . Smoking status: Never Smoker  . Smokeless tobacco: Never Used  Substance and Sexual Activity  . Alcohol use: Yes    Alcohol/week: 1.8 - 3.0 oz    Types: 3 - 5 Glasses of wine per week  . Drug use: No  . Sexual activity: Yes    Partners: Male    Birth control/protection: Pill  Lifestyle  . Physical activity:    Days per week: Not on file    Minutes per session: Not on file  . Stress: Not on file    Relationships  . Social connections:    Talks on phone: Not on file    Gets together: Not on file    Attends religious service: Not on file    Active member of club or organization: Not on file    Attends meetings of clubs or organizations: Not on file    Relationship status: Not on file  . Intimate partner violence:    Fear of current or ex partner: Not on file    Emotionally abused: Not on file    Physically abused: Not on file    Forced sexual activity: Not on file  Other Topics Concern  . Not on file  Social History Narrative  . Not on file    Review of Systems  Constitutional: Negative.   HENT: Negative.   Eyes: Negative.   Respiratory: Negative.   Cardiovascular: Negative.   Gastrointestinal: Negative.   Genitourinary: Negative.   Musculoskeletal: Negative.   Skin: Negative.   Neurological: Negative.   Endo/Heme/Allergies: Negative.   Psychiatric/Behavioral: Negative.     PHYSICAL EXAMINATION:    LMP 01/23/2018     General appearance: alert, cooperative and appears stated age Neck: no  adenopathy, supple, symmetrical, trachea midline and thyroid normal to inspection and palpation Heart: regular rate and rhythm Lungs: CTAB Abdomen: soft, non-tender; bowel sounds normal; no masses,  no organomegaly Extremities: normal, atraumatic, no cyanosis Skin: normal color, texture and turgor, no rashes or lesions Lymph: normal cervical supraclavicular and inguinal nodes Neurologic: grossly normal   ASSESSMENT High grade cervical dysplasia Anxiety, much improved on Celexa    PLAN Loop cone cervical biopsy with ECC in the OR The patient was counseled as to the risks of the procedure, including: infection, bleeding, future pregnancy risks and cervical stenosis.  Continue Celexa Start Gardasil series now   An After Visit Summary was printed and given to the patient.     

## 2018-03-01 ENCOUNTER — Ambulatory Visit: Payer: 59 | Admitting: Obstetrics and Gynecology

## 2018-03-05 ENCOUNTER — Encounter (HOSPITAL_COMMUNITY)
Admission: RE | Disposition: A | Discharge status: Home / Self Care | Payer: Self-pay | Source: Ambulatory Visit | Attending: Obstetrics and Gynecology

## 2018-03-05 ENCOUNTER — Ambulatory Visit (HOSPITAL_COMMUNITY)
Admission: RE | Admit: 2018-03-05 | Discharge: 2018-03-05 | Disposition: A | Discharge status: Home / Self Care | Payer: 59 | Source: Ambulatory Visit | Attending: Obstetrics and Gynecology | Admitting: Obstetrics and Gynecology

## 2018-03-05 ENCOUNTER — Encounter (HOSPITAL_COMMUNITY): Payer: Self-pay | Admitting: Emergency Medicine

## 2018-03-05 ENCOUNTER — Ambulatory Visit (HOSPITAL_COMMUNITY): Payer: 59 | Admitting: Anesthesiology

## 2018-03-05 ENCOUNTER — Other Ambulatory Visit: Payer: Self-pay

## 2018-03-05 DIAGNOSIS — Z79899 Other long term (current) drug therapy: Secondary | ICD-10-CM | POA: Diagnosis not present

## 2018-03-05 DIAGNOSIS — Z8349 Family history of other endocrine, nutritional and metabolic diseases: Secondary | ICD-10-CM | POA: Insufficient documentation

## 2018-03-05 DIAGNOSIS — Z8249 Family history of ischemic heart disease and other diseases of the circulatory system: Secondary | ICD-10-CM | POA: Diagnosis not present

## 2018-03-05 DIAGNOSIS — F419 Anxiety disorder, unspecified: Secondary | ICD-10-CM | POA: Diagnosis not present

## 2018-03-05 DIAGNOSIS — Z793 Long term (current) use of hormonal contraceptives: Secondary | ICD-10-CM | POA: Insufficient documentation

## 2018-03-05 DIAGNOSIS — D060 Carcinoma in situ of endocervix: Secondary | ICD-10-CM | POA: Insufficient documentation

## 2018-03-05 DIAGNOSIS — F988 Other specified behavioral and emotional disorders with onset usually occurring in childhood and adolescence: Secondary | ICD-10-CM | POA: Diagnosis not present

## 2018-03-05 DIAGNOSIS — D069 Carcinoma in situ of cervix, unspecified: Secondary | ICD-10-CM | POA: Diagnosis present

## 2018-03-05 DIAGNOSIS — R87615 Unsatisfactory cytologic smear of cervix: Secondary | ICD-10-CM

## 2018-03-05 HISTORY — PX: COLPOSCOPY: SHX161

## 2018-03-05 HISTORY — PX: LEEP: SHX91

## 2018-03-05 HISTORY — DX: Anxiety disorder, unspecified: F41.9

## 2018-03-05 LAB — CBC
HCT: 35.6 % — ABNORMAL LOW (ref 36.0–46.0)
HEMOGLOBIN: 12.4 g/dL (ref 12.0–15.0)
MCH: 31.6 pg (ref 26.0–34.0)
MCHC: 34.8 g/dL (ref 30.0–36.0)
MCV: 90.8 fL (ref 78.0–100.0)
PLATELETS: 137 10*3/uL — AB (ref 150–400)
RBC: 3.92 MIL/uL (ref 3.87–5.11)
RDW: 11.7 % (ref 11.5–15.5)
WBC: 5.4 10*3/uL (ref 4.0–10.5)

## 2018-03-05 LAB — PREGNANCY, URINE: PREG TEST UR: NEGATIVE

## 2018-03-05 SURGERY — LEEP (LOOP ELECTROSURGICAL EXCISION PROCEDURE)
Anesthesia: General

## 2018-03-05 MED ORDER — FENTANYL CITRATE (PF) 100 MCG/2ML IJ SOLN
INTRAMUSCULAR | Status: AC
  Filled 2018-03-05: qty 2

## 2018-03-05 MED ORDER — LACTATED RINGERS IV SOLN: INTRAVENOUS | Status: DC

## 2018-03-05 MED ORDER — FENTANYL CITRATE (PF) 100 MCG/2ML IJ SOLN: 25.0000 ug | INTRAMUSCULAR | Status: DC | PRN

## 2018-03-05 MED ORDER — PROPOFOL 10 MG/ML IV BOLUS
INTRAVENOUS | Status: AC
  Filled 2018-03-05: qty 40

## 2018-03-05 MED ORDER — SCOPOLAMINE 1 MG/3DAYS TD PT72
1.0000 | MEDICATED_PATCH | Freq: Once | TRANSDERMAL | Status: DC
  Administered 2018-03-05: 1.5 mg via TRANSDERMAL

## 2018-03-05 MED ORDER — LIDOCAINE-EPINEPHRINE (PF) 1 %-1:200000 IJ SOLN
INTRAMUSCULAR | Status: AC
  Filled 2018-03-05: qty 30

## 2018-03-05 MED ORDER — GLYCOPYRROLATE 0.2 MG/ML IJ SOLN
INTRAMUSCULAR | Status: DC | PRN
  Administered 2018-03-05: 0.1 mg via INTRAVENOUS

## 2018-03-05 MED ORDER — LACTATED RINGERS IV SOLN
INTRAVENOUS | Status: DC
  Administered 2018-03-05: 10:00:00 via INTRAVENOUS

## 2018-03-05 MED ORDER — PROPOFOL 10 MG/ML IV BOLUS
INTRAVENOUS | Status: DC | PRN
  Administered 2018-03-05: 160 mg via INTRAVENOUS

## 2018-03-05 MED ORDER — ONDANSETRON HCL 4 MG/2ML IJ SOLN
INTRAMUSCULAR | Status: AC
  Filled 2018-03-05: qty 2

## 2018-03-05 MED ORDER — MIDAZOLAM HCL 2 MG/2ML IJ SOLN
INTRAMUSCULAR | Status: DC | PRN
  Administered 2018-03-05: 1 mg via INTRAVENOUS

## 2018-03-05 MED ORDER — LACTATED RINGERS IV SOLN
INTRAVENOUS | Status: DC
  Administered 2018-03-05: 07:00:00 via INTRAVENOUS

## 2018-03-05 MED ORDER — GLYCOPYRROLATE 0.2 MG/ML IJ SOLN
INTRAMUSCULAR | Status: AC
  Filled 2018-03-05: qty 1

## 2018-03-05 MED ORDER — ACETIC ACID 4% SOLUTION
Status: DC | PRN
  Administered 2018-03-05: 1 via TOPICAL

## 2018-03-05 MED ORDER — FERRIC SUBSULFATE SOLN
Status: DC | PRN
  Administered 2018-03-05: 1 via TOPICAL

## 2018-03-05 MED ORDER — FERRIC SUBSULFATE 259 MG/GM EX SOLN
CUTANEOUS | Status: AC
  Filled 2018-03-05: qty 8

## 2018-03-05 MED ORDER — METOCLOPRAMIDE HCL 5 MG/ML IJ SOLN
INTRAMUSCULAR | Status: AC
  Filled 2018-03-05: qty 2

## 2018-03-05 MED ORDER — MIDAZOLAM HCL 2 MG/2ML IJ SOLN
INTRAMUSCULAR | Status: AC
  Filled 2018-03-05: qty 2

## 2018-03-05 MED ORDER — MEPERIDINE HCL 25 MG/ML IJ SOLN
INTRAMUSCULAR | Status: AC
  Filled 2018-03-05: qty 1

## 2018-03-05 MED ORDER — LIDOCAINE HCL (CARDIAC) PF 100 MG/5ML IV SOSY
PREFILLED_SYRINGE | INTRAVENOUS | Status: DC | PRN
  Administered 2018-03-05: 80 mg via INTRAVENOUS

## 2018-03-05 MED ORDER — ONDANSETRON HCL 4 MG/2ML IJ SOLN
INTRAMUSCULAR | Status: DC | PRN
  Administered 2018-03-05: 4 mg via INTRAVENOUS

## 2018-03-05 MED ORDER — IODINE STRONG (LUGOLS) 5 % PO SOLN
ORAL | Status: DC | PRN
  Administered 2018-03-05: 0.2 mL

## 2018-03-05 MED ORDER — DEXAMETHASONE SODIUM PHOSPHATE 10 MG/ML IJ SOLN
INTRAMUSCULAR | Status: AC
  Filled 2018-03-05: qty 1

## 2018-03-05 MED ORDER — IODINE STRONG (LUGOLS) 5 % PO SOLN
ORAL | Status: AC
  Filled 2018-03-05: qty 1

## 2018-03-05 MED ORDER — MEPERIDINE HCL 25 MG/ML IJ SOLN
6.2500 mg | INTRAMUSCULAR | Status: DC | PRN
  Administered 2018-03-05: 6.25 mg via INTRAVENOUS

## 2018-03-05 MED ORDER — LIDOCAINE-EPINEPHRINE (PF) 1 %-1:200000 IJ SOLN
INTRAMUSCULAR | Status: DC | PRN
  Administered 2018-03-05: 8 mL

## 2018-03-05 MED ORDER — LIDOCAINE HCL (CARDIAC) PF 100 MG/5ML IV SOSY
PREFILLED_SYRINGE | INTRAVENOUS | Status: AC
  Filled 2018-03-05: qty 5

## 2018-03-05 MED ORDER — FENTANYL CITRATE (PF) 250 MCG/5ML IJ SOLN
INTRAMUSCULAR | Status: DC | PRN
  Administered 2018-03-05: 100 ug via INTRAVENOUS

## 2018-03-05 MED ORDER — DEXAMETHASONE SODIUM PHOSPHATE 4 MG/ML IJ SOLN
INTRAMUSCULAR | Status: DC | PRN
  Administered 2018-03-05: 10 mg via INTRAVENOUS

## 2018-03-05 MED ORDER — ACETIC ACID 5 % SOLN
Status: AC
  Filled 2018-03-05: qty 500

## 2018-03-05 MED ORDER — KETOROLAC TROMETHAMINE 30 MG/ML IJ SOLN
INTRAMUSCULAR | Status: DC | PRN
  Administered 2018-03-05: 30 mg via INTRAVENOUS

## 2018-03-05 MED ORDER — SCOPOLAMINE 1 MG/3DAYS TD PT72
MEDICATED_PATCH | TRANSDERMAL | Status: AC
  Administered 2018-03-05: 1.5 mg via TRANSDERMAL
  Filled 2018-03-05: qty 1

## 2018-03-05 MED ORDER — METOCLOPRAMIDE HCL 5 MG/ML IJ SOLN
10.0000 mg | Freq: Once | INTRAMUSCULAR | Status: AC | PRN
  Administered 2018-03-05: 10 mg via INTRAVENOUS

## 2018-03-05 SURGICAL SUPPLY — 27 items
APL SWBSTK 6 STRL LF DISP (MISCELLANEOUS) ×1
APPLICATOR COTTON TIP 6 STRL (MISCELLANEOUS) ×1 IMPLANT
APPLICATOR COTTON TIP 6IN STRL (MISCELLANEOUS) ×3
ELECT BALL LEEP 5MM RED (ELECTRODE) ×3 IMPLANT
ELECT LOOP LEEP RND 10X10 YLW (CUTTING LOOP) ×3
ELECT LOOP LEEP RND 15X12 GRN (CUTTING LOOP)
ELECT LOOP LEEP RND 20X12 WHT (CUTTING LOOP)
ELECT LOOP LEEP SQR 10X10 ORG (CUTTING LOOP)
ELECT REM PT RETURN 9FT ADLT (ELECTROSURGICAL) ×3
ELECTRODE LOOP LP RND 10X10YLW (CUTTING LOOP) IMPLANT
ELECTRODE LOOP LP RND 15X12GRN (CUTTING LOOP) IMPLANT
ELECTRODE LOOP LP RND 20X12WHT (CUTTING LOOP) IMPLANT
ELECTRODE LOOP LP SQR 10X10ORG (CUTTING LOOP) IMPLANT
ELECTRODE REM PT RTRN 9FT ADLT (ELECTROSURGICAL) ×1 IMPLANT
EXTENDER ELECT LOOP LEEP 10CM (CUTTING LOOP) IMPLANT
GLOVE BIOGEL PI IND STRL 7.0 (GLOVE) ×2 IMPLANT
GLOVE BIOGEL PI INDICATOR 7.0 (GLOVE) ×4
GLOVE ECLIPSE 6.5 STRL STRAW (GLOVE) ×3 IMPLANT
GOWN STRL REUS W/TWL LRG LVL3 (GOWN DISPOSABLE) ×6 IMPLANT
NS IRRIG 1000ML POUR BTL (IV SOLUTION) ×3 IMPLANT
PACK VAGINAL MINOR WOMEN LF (CUSTOM PROCEDURE TRAY) ×3 IMPLANT
PAD OB MATERNITY 4.3X12.25 (PERSONAL CARE ITEMS) ×3 IMPLANT
PENCIL SMOKE EVAC W/HOLSTER (ELECTROSURGICAL) ×3 IMPLANT
SCOPETTES 8  STERILE (MISCELLANEOUS) ×4
SCOPETTES 8 STERILE (MISCELLANEOUS) ×2 IMPLANT
SUT SILK 2 0 SH (SUTURE) ×3 IMPLANT
TOWEL OR 17X24 6PK STRL BLUE (TOWEL DISPOSABLE) ×6 IMPLANT

## 2018-03-05 NOTE — Discharge Instructions (Signed)
°Loop Electrosurgical Excision Procedure, Care After °Refer to this sheet in the next few weeks. These instructions provide you with information about caring for yourself after your procedure. Your health care provider may also give you more specific instructions. Your treatment has been planned according to current medical practices, but problems sometimes occur. Call your health care provider if you have any problems or questions after your procedure. °What can I expect after the procedure? °After the procedure, it is common to have: °· Abdominal cramps that are similar to menstrual cramps. These may last for up to 1 week. °· Pink-tinged or bloody vaginal discharge, including light to moderate bleeding, for 1-2 weeks. °· A dark-colored vaginal discharge. This is from the paste that was applied to your cervix to control bleeding. ° °Follow these instructions at home: °Activity °· Return to your normal activities as told by your health care provider. Ask your health care provider what activities are safe for you. °· Avoid strenuous physical activity for as long as told by your health care provider. °· Do not lift anything that is heavier than 10 lb (4.5 kg) until your health care provider says that it is safe. °Bathing °· Do not take baths, swim, or use a hot tub until your health care provider approves. °· You may take showers. °Lifestyle °· Do not put anything in your vagina for 2 weeks after the procedure or until your health care provider says that it is okay. This includes tampons, creams, and douches. °· Do not have sexual intercourse until your health care provider approves. °General instructions °· Take over-the-counter and prescription medicines only as told by your health care provider. °· Keep all follow-up visits as told by your health care provider. This is important. °Contact a health care provider if: °· You have a fever or chills. °· You feel unusually weak. °· You have vaginal bleeding that is  heavier or longer than a normal menstrual cycle. A sign of this can be soaking a pad with blood. °· You have severe pain. °· You have nausea or vomiting. °· You develop a bad smelling vaginal discharge. °This information is not intended to replace advice given to you by your health care provider. Make sure you discuss any questions you have with your health care provider. °Document Released: 05/11/2011 Document Revised: 09/24/2015 Document Reviewed: 07/12/2015 °Elsevier Interactive Patient Education © 2018 [employer] ° ° °Post Anesthesia Home Care Instructions ° °Activity: °Get plenty of rest for the remainder of the day. A responsible individual must stay with you for 24 hours following the procedure.  °For the next 24 hours, DO NOT: °-Drive a car °-Operate machinery °-Drink alcoholic beverages °-Take any medication unless instructed by your physician °-Make any legal decisions or sign important papers. ° °Meals: °Start with liquid foods such as gelatin or soup. Progress to regular foods as tolerated. Avoid greasy, spicy, heavy foods. If nausea and/or vomiting occur, drink only clear liquids until the nausea and/or vomiting subsides. Call your physician if vomiting continues. ° °Special Instructions/Symptoms: °Your throat may feel dry or sore from the anesthesia or the breathing tube placed in your throat during surgery. If this causes discomfort, gargle with warm salt water. The discomfort should disappear within 24 hours. ° °If you had a scopolamine patch placed behind your ear for the management of post- operative nausea and/or vomiting: ° °1. The medication in the patch is effective for 72 hours, after which it should be removed.  Wrap patch in a tissue   and discard in the trash. Wash hands thoroughly with soap and water. °2. You may remove the patch earlier than 72 hours if you experience unpleasant side effects which may include dry mouth, dizziness or visual disturbances. °3. Avoid touching the patch. Wash  your hands with soap and water after contact with the patch. °  ° °

## 2018-03-05 NOTE — Anesthesia Postprocedure Evaluation (Signed)
Anesthesia Post Note  Patient: Mackenzie Schroeder  Procedure(s) Performed: LOOP ELECTROSURGICAL EXCISION PROCEDURE (LEEP) with ECC (N/A ) COLPOSCOPY (N/A )     Patient location during evaluation: PACU Anesthesia Type: General Level of consciousness: awake and alert Pain management: pain level controlled Vital Signs Assessment: post-procedure vital signs reviewed and stable Respiratory status: spontaneous breathing, nonlabored ventilation, respiratory function stable and patient connected to nasal cannula oxygen Cardiovascular status: blood pressure returned to baseline and stable Postop Assessment: no apparent nausea or vomiting Anesthetic complications: no    Last Vitals:  Vitals:   03/05/18 0915 03/05/18 0954  BP: 107/74 104/60  Pulse: (!) 59 (!) 58  Resp: 19 18  Temp:  36.9 C  SpO2: 100% 100%    Last Pain:  Vitals:   03/05/18 0954  TempSrc:   PainSc: 2    Pain Goal: Patients Stated Pain Goal: 3 (03/05/18 0954)               Phillips Groutarignan, Cristela Stalder

## 2018-03-05 NOTE — Anesthesia Procedure Notes (Signed)
Procedure Name: LMA Insertion Date/Time: 03/05/2018 7:39 AM Performed by: Graciela HusbandsFussell, Greenleigh Kauth O, CRNA Pre-anesthesia Checklist: Patient identified, Patient being monitored, Emergency Drugs available, Timeout performed and Suction available Patient Re-evaluated:Patient Re-evaluated prior to induction Oxygen Delivery Method: Circle System Utilized Preoxygenation: Pre-oxygenation with 100% oxygen Induction Type: IV induction Ventilation: Mask ventilation without difficulty LMA: LMA inserted LMA Size: 4.0 Number of attempts: 1 Placement Confirmation: positive ETCO2 and breath sounds checked- equal and bilateral Tube secured with: Tape Dental Injury: Teeth and Oropharynx as per pre-operative assessment

## 2018-03-05 NOTE — Interval H&P Note (Signed)
History and Physical Interval Note:  03/05/2018 7:14 AM  Mackenzie Schroeder  has presented today for surgery, with the diagnosis of CIN 3  The various methods of treatment have been discussed with the patient and family. After consideration of risks, benefits and other options for treatment, the patient has consented to  Procedure(s) with comments: LOOP ELECTROSURGICAL EXCISION PROCEDURE (LEEP) with ECC (N/A) - with ECC COLPOSCOPY (N/A) as a surgical intervention .  The patient's history has been reviewed, patient examined, no change in status, stable for surgery.  I have reviewed the patient's chart and labs.  Questions were answered to the patient's satisfaction.     Romualdo BolkJill Evelyn Ilah Boule

## 2018-03-05 NOTE — Anesthesia Preprocedure Evaluation (Signed)
Anesthesia Evaluation  Patient identified by MRN, date of birth, ID band Patient awake    Reviewed: Allergy & Precautions, NPO status , Patient's Chart, lab work & pertinent test results  Airway Mallampati: II  TM Distance: >3 FB Neck ROM: Full    Dental no notable dental hx.    Pulmonary neg pulmonary ROS,    Pulmonary exam normal breath sounds clear to auscultation       Cardiovascular negative cardio ROS Normal cardiovascular exam Rhythm:Regular Rate:Normal     Neuro/Psych negative neurological ROS  negative psych ROS   GI/Hepatic negative GI ROS, Neg liver ROS,   Endo/Other  negative endocrine ROS  Renal/GU negative Renal ROS  negative genitourinary   Musculoskeletal negative musculoskeletal ROS (+)   Abdominal   Peds negative pediatric ROS (+)  Hematology negative hematology ROS (+)   Anesthesia Other Findings   Reproductive/Obstetrics negative OB ROS                             Anesthesia Physical Anesthesia Plan  ASA: I  Anesthesia Plan: General   Post-op Pain Management:    Induction:   PONV Risk Score and Plan: 3 and Ondansetron, Dexamethasone, Midazolam and Treatment may vary due to age or medical condition  Airway Management Planned: LMA  Additional Equipment:   Intra-op Plan:   Post-operative Plan:   Informed Consent: I have reviewed the patients History and Physical, chart, labs and discussed the procedure including the risks, benefits and alternatives for the proposed anesthesia with the patient or authorized representative who has indicated his/her understanding and acceptance.   Dental advisory given  Plan Discussed with:   Anesthesia Plan Comments:         Anesthesia Quick Evaluation

## 2018-03-05 NOTE — Transfer of Care (Signed)
Immediate Anesthesia Transfer of Care Note  Patient: Mackenzie Schroeder  Procedure(s) Performed: LOOP ELECTROSURGICAL EXCISION PROCEDURE (LEEP) with ECC (N/A ) COLPOSCOPY (N/A )  Patient Location: PACU  Anesthesia Type:General  Level of Consciousness: awake, alert  and patient cooperative  Airway & Oxygen Therapy: Patient Spontanous Breathing and Patient connected to nasal cannula oxygen  Post-op Assessment: Report given to RN and Post -op Vital signs reviewed and stable  Post vital signs: Reviewed and stable  Last Vitals:  Vitals Value Taken Time  BP    Temp    Pulse 86 03/05/2018  8:17 AM  Resp    SpO2 100 % 03/05/2018  8:17 AM  Vitals shown include unvalidated device data.  Last Pain:  Vitals:   03/05/18 0622  TempSrc: Oral      Patients Stated Pain Goal: 3 (03/05/18 0622)  Complications: No apparent anesthesia complications

## 2018-03-05 NOTE — Op Note (Signed)
Preoperative Diagnosis: CIN III, unsatisfactory colposcopy  Postoperative Diagnosis: Same  Procedure: Loop cone cervical biopsy with endocervical curettage under colposcopic guidance.   Surgeon: Dr Gertie ExonJill Destyn Schuyler  Assistants: None  Anesthesia: General via LMA  EBL: 5 cc  Fluids: 1,000 cc LR  Indications for surgery: The patient is a 22 yo female, who presented with an abnormal pap. Colposcopy was unsatisfactory, cervical biopsy with CIN II-III, ECC with atypical cells concerning for dysplasia. The patient could not tolerate an office procedure.  The patient was counseled as to the risks of the procedure, including: infection, bleeding, future pregnancy risks and cervical stenosis. A consent form was signed.   Findings: Colposcopy was unsatisfactory  Specimens: loop cone exocervix, loop cone endocervix and endocervical curettage   Procedure: The patient was taken to the operating room with an IV in place. She was placed in the dorsal lithotomy position and anesthesia was administered. She voided on the way to the OR.  A coated speculum was placed in the vagina. Under colposcopic guidance, acetic acid and then Lugols solution was placed on the cervix and a paracervical block was injected using 1% lidocaine with epinephrine. Under colposcopic guidance, the 2 x 0.8 cm loop was used to remove a portion of the ectocervix taking care to get the entire transformation zone.  A second 1 x 1 cm loop was used to remove a portion of the endocervix. The settings were 55 cut, 50 coag with a blend of 1.  An ECC was performed. The cautery ball was then used to cauterize the base of the biopsy site and monsels were placed. Hemostasis was excellent.  The speculum was removed and the patient was taken out of the dorsal lithotomy position. Upon awakening the LMA was removed and she was transferred to the recovery room in good condition.  There were no complications.

## 2018-03-06 ENCOUNTER — Encounter (HOSPITAL_COMMUNITY): Payer: Self-pay | Admitting: Obstetrics and Gynecology

## 2018-03-08 ENCOUNTER — Telehealth: Payer: Self-pay

## 2018-03-08 NOTE — Telephone Encounter (Signed)
-----   Message from Romualdo BolkJill Evelyn Jertson, MD sent at 03/07/2018  5:02 PM EDT ----- Please let the patient know that her leep had CIN III with negative margins and ECC. She should be cured! She needs f/u one month s/p leep and pap with hpv next year at her annual exam

## 2018-03-08 NOTE — Telephone Encounter (Signed)
Spoke with patient. Results given. Patient verbalizes understanding. 08 recall entered. 

## 2018-03-28 ENCOUNTER — Other Ambulatory Visit: Payer: Self-pay | Admitting: Obstetrics and Gynecology

## 2018-03-28 NOTE — Telephone Encounter (Signed)
Medication refill request:  Junel Fe  Last AEX:  01/02/18  Next AEX: 02/19/19 Last MMG (if hormonal medication request): NA Refill authorized: Please refill if appropriate.

## 2018-03-28 NOTE — Telephone Encounter (Signed)
Patient due for med check and f/u leep. Please schedule. One pack sent

## 2018-04-02 ENCOUNTER — Encounter: Payer: Self-pay | Admitting: Obstetrics and Gynecology

## 2018-04-02 ENCOUNTER — Ambulatory Visit (INDEPENDENT_AMBULATORY_CARE_PROVIDER_SITE_OTHER): Payer: 59 | Admitting: Obstetrics and Gynecology

## 2018-04-02 ENCOUNTER — Other Ambulatory Visit: Payer: Self-pay

## 2018-04-02 VITALS — BP 110/60 | HR 68 | Wt 127.8 lb

## 2018-04-02 DIAGNOSIS — Z309 Encounter for contraceptive management, unspecified: Secondary | ICD-10-CM | POA: Diagnosis not present

## 2018-04-02 DIAGNOSIS — Z87410 Personal history of cervical dysplasia: Secondary | ICD-10-CM

## 2018-04-02 DIAGNOSIS — L700 Acne vulgaris: Secondary | ICD-10-CM | POA: Diagnosis not present

## 2018-04-02 MED ORDER — DROSPIRENONE-ETHINYL ESTRADIOL 3-0.02 MG PO TABS: 1.0000 | ORAL_TABLET | Freq: Every day | ORAL | 2 refills | Status: DC

## 2018-04-02 NOTE — Progress Notes (Signed)
GYNECOLOGY  VISIT   HPI: 22 y.o.   Single  Caucasian  female   G0P0000 with Patient's last menstrual period was 03/25/2018 (exact date).   here for 1 month post op LEEP with ECC. Pathology with CIN III, negative margins and negative ECC. She does c/o an increase in acne on this pill. Otherwise doing well on OCP's, cycles are better. Less cramping, lighter flow.   GYNECOLOGIC HISTORY: Patient's last menstrual period was 03/25/2018 (exact date). Contraception: Junel Fe 1/20 Menopausal hormone therapy: none        OB History    Gravida  0   Para  0   Term  0   Preterm  0   AB  0   Living  0     SAB  0   TAB  0   Ectopic  0   Multiple  0   Live Births                 There are no active problems to display for this patient.   Past Medical History:  Diagnosis Date  . Abnormal Pap smear of cervix   . ADD (attention deficit disorder)    no meds currently  . Anxiety   . History of mononucleosis 12/13    Past Surgical History:  Procedure Laterality Date  . COLPOSCOPY N/A 03/05/2018   Procedure: COLPOSCOPY;  Surgeon: Salvadore Dom, MD;  Location: [facility];  Service: Gynecology;  Laterality: N/A;  . LEEP N/A 03/05/2018   Procedure: LOOP ELECTROSURGICAL EXCISION PROCEDURE (LEEP) with ECC;  Surgeon: Salvadore Dom, MD;  Location: [facility];  Service: Gynecology;  Laterality: N/A;  with ECC  . WISDOM TOOTH EXTRACTION      Current Outpatient Medications  Medication Sig Dispense Refill  . citalopram (CELEXA) 20 MG tablet Take 1/2 a tablet po qd for one week, if tolerating, then increase to one tablet a day. (Patient taking differently: Take 20 mg by mouth daily before lunch. ) 90 tablet 3  . JUNEL FE 1/20 1-20 MG-MCG tablet TAKE 1 TABLET BY MOUTH EVERY DAY 28 tablet 0  . naproxen sodium (ANAPROX DS) 550 MG tablet 1 tablet po q 12 hours prn pain (Patient taking differently: Take 550 mg by mouth 2 (two) times daily as needed (for menstrual pains.). ) 30 tablet 2    No current facility-administered medications for this visit.      ALLERGIES: Patient has no known allergies.  Family History  Problem Relation Age of Onset  . Multiple myeloma Maternal Grandmother   . Heart disease Maternal Grandfather        heart stents  . Graves' disease Mother     Social History   Socioeconomic History  . Marital status: Single    Spouse name: Not on file  . Number of children: Not on file  . Years of education: Not on file  . Highest education level: Not on file  Occupational History  . Not on file  Social Needs  . Financial resource strain: Not on file  . Food insecurity:    Worry: Not on file    Inability: Not on file  . Transportation needs:    Medical: Not on file    Non-medical: Not on file  Tobacco Use  . Smoking status: Never Smoker  . Smokeless tobacco: Never Used  Substance and Sexual Activity  . Alcohol use: Yes    Alcohol/week: 1.8 - 3.0 oz    Types: 3 -  5 Glasses of wine per week  . Drug use: No  . Sexual activity: Yes    Partners: Male    Birth control/protection: Pill  Lifestyle  . Physical activity:    Days per week: Not on file    Minutes per session: Not on file  . Stress: Not on file  Relationships  . Social connections:    Talks on phone: Not on file    Gets together: Not on file    Attends religious service: Not on file    Active member of club or organization: Not on file    Attends meetings of clubs or organizations: Not on file    Relationship status: Not on file  . Intimate partner violence:    Fear of current or ex partner: Not on file    Emotionally abused: Not on file    Physically abused: Not on file    Forced sexual activity: Not on file  Other Topics Concern  . Not on file  Social History Narrative  . Not on file    Review of Systems  Constitutional: Negative.   HENT: Negative.   Eyes: Negative.   Respiratory: Negative.   Cardiovascular: Negative.   Gastrointestinal: Negative.    Genitourinary: Negative.   Musculoskeletal: Negative.   Skin: Negative.   Neurological: Negative.   Endo/Heme/Allergies: Negative.   Psychiatric/Behavioral: Negative.     PHYSICAL EXAMINATION:    BP 110/60 (BP Location: Right Arm, Patient Position: Sitting)   Pulse 68   Wt 127 lb 12.8 oz (58 kg)   LMP 03/25/2018 (Exact Date)   BMI 23.37 kg/m     General appearance: alert, cooperative and appears stated age Skin: mild facial acne  Pelvic: External genitalia:  no lesions              Urethra:  normal appearing urethra with no masses, tenderness or lesions              Bartholins and Skenes: normal                 Vagina: normal appearing vagina with normal color and discharge, no lesions              Cervix: no lesions and slight ectropion posteriorly              Bimanual Exam:  Uterus:  normal size, contour, position, consistency, mobility, non-tender              Adnexa: no mass, fullness, tenderness               Chaperone was present for exam.  ASSESSMENT H/O cervical dysplasia Acne on loestrin 1/20    PLAN F/u pap and hpv next year Change to Yaz   An After Visit Summary was printed and given to the patient.

## 2018-04-17 ENCOUNTER — Ambulatory Visit: Payer: 59 | Admitting: Obstetrics and Gynecology

## 2018-04-25 ENCOUNTER — Other Ambulatory Visit: Payer: Self-pay | Admitting: Obstetrics and Gynecology

## 2018-04-26 NOTE — Telephone Encounter (Signed)
Patient switched to Yaz last month. Refill not appropriate

## 2018-04-26 NOTE — Telephone Encounter (Signed)
Medication refill request: junel fe 1/20 Last AEX:  01-02-18 Next AEX: 02-19-19 Last MMG (if hormonal medication request): none Refill authorized: 03-28-18 given 1mth supply with 0 refills. Please approve if appropriate

## 2018-04-29 NOTE — Telephone Encounter (Signed)
Patient would like to switch back to [facility] for birth control because of insurance reasons.

## 2018-04-29 NOTE — Telephone Encounter (Signed)
Message left to return call to Leonidus Rowand at [phone].    

## 2018-04-30 MED ORDER — NORETHIN ACE-ETH ESTRAD-FE 1-20 MG-MCG PO TABS: 1.0000 | ORAL_TABLET | Freq: Every day | ORAL | 3 refills | Status: DC

## 2018-04-30 NOTE — Telephone Encounter (Signed)
Patient is retuning a call to [name].  °

## 2018-04-30 NOTE — Telephone Encounter (Signed)
Message left to return call to [place] at [phone].  Can talk with any nurse on return call.

## 2018-04-30 NOTE — Telephone Encounter (Signed)
Spoke with patient.  She has not started the Yaz and did not pick up the prescription because the cost was 40 dollars per month. Currently on her cycle now.  She would like to continue on Junel FE 1/20 as this is free for her under her insurance. [facility] and feels she can take care of acne with OTC products.   Advised need order from Dr. Oscar LaJertson. Will call back if any further concerns.   Dr. Oscar LaJertson please advise if okay to DC Yaz order and [facility].

## 2018-04-30 NOTE — Telephone Encounter (Signed)
Noted message from Dr. Oscar LaJertson. Encounter closed.

## 2018-04-30 NOTE — Telephone Encounter (Signed)
That's fine, will refill the junel.

## 2019-02-10 ENCOUNTER — Telehealth: Payer: Self-pay | Admitting: Obstetrics and Gynecology

## 2019-02-10 NOTE — Telephone Encounter (Signed)
Patient is having abnormal bleeding. °

## 2019-02-10 NOTE — Telephone Encounter (Signed)
Spoke with patient. Patient states she has been on OCP for 1 yr. Reports menses has been on now for 3wks. Reports intermittent menses like cramps 2/10.  Reports taking 1 pill late, has done this in the past and not experienced irregular bleeding. Reports weight gain over the last few months, 125bs to 145lbs. Denies N/V, fever/chills, breast tenderness. Flow is light, changing pad 3 x/day. Patient is SA.  Advised irregular menses can be caused by late or missed pills. Reviewed importance of taking daily and roughly same time. Advised patient to take UPT to r/o pregnancy. If positive, go directly to [facility] for further evaluation. If negative, continue to monitor bleeding, can further discuss at AEX on 02/19/19 with Dr. Oscar La. Advised to return call to office if bleeding becomes heavy or new symptoms develop. Advised Dr. Oscar La will review, our office will return call if any additional recommendations. Patient verbalizes understanding.   Routing to provider for final review. Patient is agreeable to disposition. Will close encounter.

## 2019-02-17 NOTE — Progress Notes (Signed)
23 y.o. G41P0000 Married female here for annual exam.   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   Got married last November. No plans to get pregnant in the next year.   She was having light, monthly cycles on the pill until last month. She has had bleeding for the last 3.5 weeks, initially like a light period, now just spotting. She was late taking one pill. She has had 2 negative UPT's at home. She is due for her cycle now. Up until the last month, her pill was great.   Celexa is helping her anxiety, feels some increase in her anxiety in the last few months.   She has gained ~20 lbs in the last few months, working hard to loose it and can't.  She has been working from home with Covid.   Period Duration (Days): cycle is usually 3 days, last cycle has lasted 3.5 weeks Period Pattern: (!) Irregular Menstrual Flow: Light, Moderate Menstrual Control: Thin pad, Panty liner Menstrual Control Change Freq (Hours): changes pad/panty liner every 6-8 hours Dysmenorrhea: (!) Moderate Dysmenorrhea Symptoms: Cramping  Patient's last menstrual period was 01/24/2019 (exact date).          Sexually active: Yes.    The current method of family planning is OCP (estrogen/progesterone).    Exercising: Yes.    running Smoker:  no  Health Maintenance: Pap:  01/02/2018 [facility] History of abnormal pap: Yes, 02/06/2018 CIN III & atypical cells on ECC, 03/05/2018 LEEP CIN III with negative margins TDaP: UTD Gardasil: Completed 1    reports that she has never smoked. She has never used smokeless tobacco. She reports current alcohol use of about 3.0 - 5.0 standard drinks of alcohol per week. She reports that she does not use drugs. 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 Abnormal Pap smear of cervix   . ADD (attention deficit disorder)    no meds currently  . Anxiety   . History of mononucleosis 12/13    Past Surgical  History:  Procedure Laterality Date  . COLPOSCOPY N/A 03/05/2018   Procedure: COLPOSCOPY;  Surgeon: Salvadore Dom, MD;  Location: [facility];  Service: Gynecology;  Laterality: N/A;  . LEEP N/A 03/05/2018   Procedure: LOOP ELECTROSURGICAL EXCISION PROCEDURE (LEEP) with ECC;  Surgeon: Salvadore Dom, MD;  Location: [facility];  Service: Gynecology;  Laterality: N/A;  with ECC  . WISDOM TOOTH EXTRACTION      Current Outpatient Medications  Medication Sig Dispense Refill  . citalopram (CELEXA) 20 MG tablet Take 1/2 a tablet po qd for one week, if tolerating, then increase to one tablet a day. (Patient taking differently: Take 20 mg by mouth daily before lunch. ) 90 tablet 3  . naproxen sodium (ANAPROX DS) 550 MG tablet 1 tablet po q 12 hours prn pain (Patient taking differently: Take 550 mg by mouth 2 (two) times daily as needed (for menstrual pains.). ) 30 tablet 2  . norethindrone-ethinyl estradiol (JUNEL FE 1/20) 1-20 MG-MCG tablet Take 1 tablet by mouth daily. 3 Package 3   No current facility-administered medications for this visit.     Family History  Problem Relation Age of Onset  . Multiple myeloma Maternal Grandmother   . Heart disease Maternal Grandfather        heart stents  . Graves' disease Mother     Review of Systems  Constitutional:  Weight gain  HENT: Negative.   Eyes: Negative.   Respiratory: Negative.   Cardiovascular: Negative.   Gastrointestinal:       Bloating  Endocrine: Negative.   Genitourinary: Positive for menstrual problem.  Musculoskeletal: Negative.   Skin: Negative.   Allergic/Immunologic: Negative.   Neurological: Negative.   Hematological: Negative.   Psychiatric/Behavioral: Negative.     Exam:   BP 120/82 (BP Location: Right Arm, Patient Position: Sitting, Cuff Size: Normal)   Pulse 76   Temp 98.4 F (36.9 C) (Skin)   Ht 5' 3.25" (1.607 m)   Wt 142 lb 3.2 oz (64.5 kg)   LMP 01/24/2019 (Exact Date)   BMI 24.99 kg/m   Weight  change: _0 @ Height:   Height: 5' 3.25" (160.7 cm)  Ht Readings from Last 3 Encounters:  02/19/19 5' 3.25" (1.607 m)  03/05/18 _1  (1.575 m)  02/14/18 _2  (1.575 m)    General appearance: alert, cooperative and appears stated age Head: Normocephalic, without obvious abnormality, atraumatic Neck: no adenopathy, supple, symmetrical, trachea midline and thyroid normal to inspection and palpation Lungs: clear to auscultation bilaterally Cardiovascular: regular rate and rhythm Breasts: normal appearance, no masses or tenderness Abdomen: soft, non-tender; non distended,  no masses,  no organomegaly Extremities: extremities normal, atraumatic, no cyanosis or edema Skin: Skin color, texture, turgor normal. No rashes or lesions Lymph nodes: Cervical, supraclavicular, and axillary nodes normal. No abnormal inguinal nodes palpated Neurologic: Grossly normal   Pelvic: External genitalia:  no lesions              Urethra:  normal appearing urethra with no masses, tenderness or lesions              Bartholins and Skenes: normal                 Vagina: normal appearing vagina with normal color and discharge, no lesions              Cervix: no lesions               Bimanual Exam:  Uterus:  normal size, contour, position, consistency, mobility, non-tender              Adnexa: no mass, fullness, tenderness               Rectovaginal: deferred  Chaperone was present for exam.  A:  Well Woman with normal exam  H/O LEEP  Contraception surveillance, overall she has done very well on OCP's, abnormal BTB this month, late pill.  Weight gain  Anxiety, helped with Celexa, feels she could benefit from increasing her dose  P:   Reports negative UPT at home x 2  Continue with OCP's, call if AUB persists  Take pills at the same time daily  Increase celexa to 30 mg a day, call with issues  Screening labs, TSH  Pap with hpv, GC/CT, declines other STD testing  Information given on breast  exams, calcium and vit D

## 2019-02-19 ENCOUNTER — Ambulatory Visit (INDEPENDENT_AMBULATORY_CARE_PROVIDER_SITE_OTHER): Payer: Managed Care, Other (non HMO) | Admitting: Obstetrics and Gynecology

## 2019-02-19 ENCOUNTER — Encounter: Payer: Self-pay | Admitting: Obstetrics and Gynecology

## 2019-02-19 ENCOUNTER — Other Ambulatory Visit: Payer: Self-pay

## 2019-02-19 ENCOUNTER — Other Ambulatory Visit (HOSPITAL_COMMUNITY)
Admission: RE | Admit: 2019-02-19 | Discharge: 2019-02-19 | Disposition: A | Discharge status: Home / Self Care | Payer: Managed Care, Other (non HMO) | Source: Ambulatory Visit | Attending: Obstetrics and Gynecology | Admitting: Obstetrics and Gynecology

## 2019-02-19 VITALS — BP 120/82 | HR 76 | Temp 98.4°F | Ht 63.25 in | Wt 142.2 lb

## 2019-02-19 DIAGNOSIS — F419 Anxiety disorder, unspecified: Secondary | ICD-10-CM

## 2019-02-19 DIAGNOSIS — R635 Abnormal weight gain: Secondary | ICD-10-CM

## 2019-02-19 DIAGNOSIS — Z9889 Other specified postprocedural states: Secondary | ICD-10-CM

## 2019-02-19 DIAGNOSIS — Z0000 Encounter for general adult medical examination without abnormal findings: Secondary | ICD-10-CM

## 2019-02-19 DIAGNOSIS — Z3041 Encounter for surveillance of contraceptive pills: Secondary | ICD-10-CM | POA: Diagnosis not present

## 2019-02-19 DIAGNOSIS — Z124 Encounter for screening for malignant neoplasm of cervix: Secondary | ICD-10-CM | POA: Diagnosis present

## 2019-02-19 DIAGNOSIS — Z01419 Encounter for gynecological examination (general) (routine) without abnormal findings: Secondary | ICD-10-CM | POA: Diagnosis not present

## 2019-02-19 MED ORDER — NORETHIN ACE-ETH ESTRAD-FE 1-20 MG-MCG PO TABS: 1.0000 | ORAL_TABLET | Freq: Every day | ORAL | 3 refills | Status: DC

## 2019-02-19 MED ORDER — CITALOPRAM HYDROBROMIDE 10 MG PO TABS: 10.0000 mg | ORAL_TABLET | Freq: Every day | ORAL | 3 refills | Status: DC

## 2019-02-19 MED ORDER — CITALOPRAM HYDROBROMIDE 20 MG PO TABS: ORAL_TABLET | ORAL | 3 refills | Status: DC

## 2019-02-19 NOTE — Patient Instructions (Signed)
EXERCISE AND DIET:  We recommended that you start or continue a regular exercise program for good health. Regular exercise means any activity that makes your heart beat faster and makes you sweat.  We recommend exercising at least 30 minutes per day at least 3 days a week, preferably 4 or 5.  We also recommend a diet low in fat and sugar.  Inactivity, poor dietary choices and obesity can cause diabetes, heart attack, stroke, and kidney damage, among others.    ALCOHOL AND SMOKING:  Women should limit their alcohol intake to no more than 7 drinks/beers/glasses of wine (combined, not each!) per week. Moderation of alcohol intake to this level decreases your risk of breast cancer and liver damage. And of course, no recreational drugs are part of a healthy lifestyle.  And absolutely no smoking or even second hand smoke. Most people know smoking can cause heart and lung diseases, but did you know it also contributes to weakening of your bones? Aging of your skin?  Yellowing of your teeth and nails?  CALCIUM AND VITAMIN D:  Adequate intake of calcium and Vitamin D are recommended.  The recommendations for exact amounts of these supplements seem to change often, but generally speaking 1,000 mg of calcium (between diet and supplement) and 800 units of Vitamin D per day seems prudent. Certain women may benefit from higher intake of Vitamin D.  If you are among these women, your doctor will have told you during your visit.    PAP SMEARS:  Pap smears, to check for cervical cancer or precancers,  have traditionally been done yearly, although recent scientific advances have shown that most women can have pap smears less often.  However, every woman still should have a physical exam from her gynecologist every year. It will include a breast check, inspection of the vulva and vagina to check for abnormal growths or skin changes, a visual exam of the cervix, and then an exam to evaluate the size and shape of the uterus and  ovaries.  And after 24 years of age, a rectal exam is indicated to check for rectal cancers. We will also provide age appropriate advice regarding health maintenance, like when you should have certain vaccines, screening for sexually transmitted diseases, bone density testing, colonoscopy, mammograms, etc.   MAMMOGRAMS:  All women over 40 years old should have a yearly mammogram. Many facilities now offer a "3D" mammogram, which may cost around $50 extra out of pocket. If possible,  we recommend you accept the option to have the 3D mammogram performed.  It both reduces the number of women who will be called back for extra views which then turn out to be normal, and it is better than the routine mammogram at detecting truly abnormal areas.    COLON CANCER SCREENING: Now recommend starting at age 45. At this time colonoscopy is not covered for routine screening until 50. There are take home tests that can be done between 45-49.   COLONOSCOPY:  Colonoscopy to screen for colon cancer is recommended for all women at age 50.  We know, you hate the idea of the prep.  We agree, BUT, having colon cancer and not knowing it is worse!!  Colon cancer so often starts as a polyp that can be seen and removed at colonscopy, which can quite literally save your life!  And if your first colonoscopy is normal and you have no family history of colon cancer, most women don't have to have it again for   10 years.  Once every ten years, you can do something that may end up saving your life, right?  We will be happy to help you get it scheduled when you are ready.  Be sure to check your insurance coverage so you understand how much it will cost.  It may be covered as a preventative service at no cost, but you should check your particular policy.      Breast Self-Awareness Breast self-awareness means being familiar with how your breasts look and feel. It involves checking your breasts regularly and reporting any changes to your  health care provider. Practicing breast self-awareness is important. A change in your breasts can be a sign of a serious medical problem. Being familiar with how your breasts look and feel allows you to find any problems early, when treatment is more likely to be successful. All women should practice breast self-awareness, including women who have had breast implants. How to do a breast self-exam One way to learn what is normal for your breasts and whether your breasts are changing is to do a breast self-exam. To do a breast self-exam: Look for Changes  1. Remove all the clothing above your waist. 2. Stand in front of a mirror in a room with good lighting. 3. Put your hands on your hips. 4. Push your hands firmly downward. 5. Compare your breasts in the mirror. Look for differences between them (asymmetry), such as: ? Differences in shape. ? Differences in size. ? Puckers, dips, and bumps in one breast and not the other. 6. Look at each breast for changes in your skin, such as: ? Redness. ? Scaly areas. 7. Look for changes in your nipples, such as: ? Discharge. ? Bleeding. ? Dimpling. ? Redness. ? A change in position. Feel for Changes Carefully feel your breasts for lumps and changes. It is best to do this while lying on your back on the floor and again while sitting or standing in the shower or tub with soapy water on your skin. Feel each breast in the following way:  Place the arm on the side of the breast you are examining above your head.  Feel your breast with the other hand.  Start in the nipple area and make  inch (2 cm) overlapping circles to feel your breast. Use the pads of your three middle fingers to do this. Apply light pressure, then medium pressure, then firm pressure. The light pressure will allow you to feel the tissue closest to the skin. The medium pressure will allow you to feel the tissue that is a little deeper. The firm pressure will allow you to feel the tissue  close to the ribs.  Continue the overlapping circles, moving downward over the breast until you feel your ribs below your breast.  Move one finger-width toward the center of the body. Continue to use the  inch (2 cm) overlapping circles to feel your breast as you move slowly up toward your collarbone.  Continue the up and down exam using all three pressures until you reach your armpit.  Write Down What You Find  Write down what is normal for each breast and any changes that you find. Keep a written record with breast changes or normal findings for each breast. By writing this information down, you do not need to depend only on memory for size, tenderness, or location. Write down where you are in your menstrual cycle, if you are still menstruating. If you are having trouble noticing differences   in your breasts, do not get discouraged. With time you will become more familiar with the variations in your breasts and more comfortable with the exam. How often should I examine my breasts? Examine your breasts every month. If you are breastfeeding, the best time to examine your breasts is after a feeding or after using a breast pump. If you menstruate, the best time to examine your breasts is 5-7 days after your period is over. During your period, your breasts are lumpier, and it may be more difficult to notice changes. When should I see my health care provider? See your health care provider if you notice:  A change in shape or size of your breasts or nipples.  A change in the skin of your breast or nipples, such as a reddened or scaly area.  Unusual discharge from your nipples.  A lump or thick area that was not there before.  Pain in your breasts.  Anything that concerns you.  

## 2019-02-20 LAB — CBC
Hematocrit: 36.9 % (ref 34.0–46.6)
Hemoglobin: 12.4 g/dL (ref 11.1–15.9)
MCH: 32.3 pg (ref 26.6–33.0)
MCHC: 33.6 g/dL (ref 31.5–35.7)
MCV: 96 fL (ref 79–97)
Platelets: 193 10*3/uL (ref 150–450)
RBC: 3.84 x10E6/uL (ref 3.77–5.28)
RDW: 12.4 % (ref 11.7–15.4)
WBC: 5.2 10*3/uL (ref 3.4–10.8)

## 2019-02-20 LAB — LIPID PANEL
Chol/HDL Ratio: 3.4 ratio (ref 0.0–4.4)
Cholesterol, Total: 186 mg/dL (ref 100–199)
HDL: 55 mg/dL (ref >39)
LDL Calculated: 97 mg/dL (ref 0–99)
Triglycerides: 168 mg/dL — ABNORMAL HIGH (ref 0–149)
VLDL Cholesterol Cal: 34 mg/dL (ref 5–40)

## 2019-02-20 LAB — TSH: TSH: 2 u[IU]/mL (ref 0.450–4.500)

## 2019-02-20 LAB — COMPREHENSIVE METABOLIC PANEL
ALT: 14 IU/L (ref 0–32)
AST: 17 IU/L (ref 0–40)
Albumin/Globulin Ratio: 1.5 (ref 1.2–2.2)
Albumin: 4.1 g/dL (ref 3.9–5.0)
Alkaline Phosphatase: 36 IU/L — ABNORMAL LOW (ref 39–117)
BUN/Creatinine Ratio: 15 (ref 9–23)
BUN: 12 mg/dL (ref 6–20)
Bilirubin Total: 0.2 mg/dL (ref 0.0–1.2)
CO2: 23 mmol/L (ref 20–29)
Calcium: 8.9 mg/dL (ref 8.7–10.2)
Chloride: 101 mmol/L (ref 96–106)
Creatinine, Ser: 0.81 mg/dL (ref 0.57–1.00)
GFR calc Af Amer: 118 mL/min/{1.73_m2} (ref >59)
GFR calc non Af Amer: 102 mL/min/{1.73_m2} (ref >59)
Globulin, Total: 2.7 g/dL (ref 1.5–4.5)
Glucose: 88 mg/dL (ref 65–99)
Potassium: 4 mmol/L (ref 3.5–5.2)
Sodium: 135 mmol/L (ref 134–144)
Total Protein: 6.8 g/dL (ref 6.0–8.5)

## 2019-02-21 LAB — CYTOLOGY - PAP
Chlamydia: NEGATIVE
Diagnosis: NEGATIVE
HPV: DETECTED — AB
Neisseria Gonorrhea: NEGATIVE

## 2019-02-25 ENCOUNTER — Telehealth: Payer: Self-pay

## 2019-02-25 DIAGNOSIS — B977 Papillomavirus as the cause of diseases classified elsewhere: Secondary | ICD-10-CM

## 2019-02-25 NOTE — Telephone Encounter (Signed)
Salvadore Dom, MD  Styles Fambro, Harley Hallmark, RN        Her triglycerides were mildly elevated (fine if she wasn't fasting). The rest of her blood work is normal. Pap is pending.    Spoke with patient. All results given. Patient verbalizes understanding. Patient is on OCP for birth control. Started a new pack last week. Colposcopy scheduled for 03/04/2019 at 4 pm with Dr.Jertson.  Instructions given. Motrin 800 mg po x , one hour before appointment with food. Make sure to eat a meal before appointment and drink plenty of fluids. Patient verbalized understanding and will call to reschedule if will be on menses or has any concerns regarding pregnancy.Patient agreeable and verbalized understanding of all instructions.  Order placed for precert.  Routing to provider and will close encounter.

## 2019-02-25 NOTE — Telephone Encounter (Signed)
-----   Message from Salvadore Dom, MD sent at 02/23/2019  3:38 PM EDT ----- Please call the patient and let her know that her pap is negative, cervical cultures are negative, but she is still + for hpv (S/P leep last year with negative margins). She will need another colposcopy.

## 2019-02-26 ENCOUNTER — Telehealth: Payer: Self-pay | Admitting: Obstetrics and Gynecology

## 2019-02-26 NOTE — Telephone Encounter (Signed)
Please disregard previous note made in error.- Spoke with patient to convey benefits for colposcopy Patient understands/agreeable with the benefits. Appointment scheduled 03/04/19. Patient is aware of cancellation policy

## 2019-02-26 NOTE — Telephone Encounter (Signed)
Spoke with patient to convey benefits for IUD exchange. Patient understands/agreeable with the benefits. Appointment scheduled 03/04/19. Patient is aware of cancellation policy.

## 2019-03-03 NOTE — Progress Notes (Signed)
GYNECOLOGY  VISIT   HPI: 23 y.o.   Married White or Caucasian Not Hispanic or Latino  female   G0P0000 with Patient's last menstrual period was 01/24/2019.   here for colposcopy for negative pap with positive HPV. Would like 2nd HPV vaccine today. 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   GYNECOLOGIC HISTORY: Patient's last menstrual period was 01/24/2019. Contraception:OCP Menopausal hormone therapy: None        OB History    Gravida  0   Para  0   Term  0   Preterm  0   AB  0   Living  0     SAB  0   TAB  0   Ectopic  0   Multiple  0   Live Births                 There are no active problems to display for this patient.   Past Medical History:  Diagnosis Date  . Abnormal Pap smear of cervix   . ADD (attention deficit disorder)    no meds currently  . Anxiety   . History of mononucleosis 12/13    Past Surgical History:  Procedure Laterality Date  . COLPOSCOPY N/A 03/05/2018   Procedure: COLPOSCOPY;  Surgeon: Salvadore Dom, MD;  Location: [facility];  Service: Gynecology;  Laterality: N/A;  . LEEP N/A 03/05/2018   Procedure: LOOP ELECTROSURGICAL EXCISION PROCEDURE (LEEP) with ECC;  Surgeon: Salvadore Dom, MD;  Location: [facility];  Service: Gynecology;  Laterality: N/A;  with ECC  . WISDOM TOOTH EXTRACTION      Current Outpatient Medications  Medication Sig Dispense Refill  . citalopram (CELEXA) 10 MG tablet Take 1 tablet (10 mg total) by mouth daily. 90 tablet 3  . citalopram (CELEXA) 20 MG tablet Take one tablet daily, take with the 10 mg tablet for a total dose of 30 mg a day 90 tablet 3  . naproxen sodium (ANAPROX DS) 550 MG tablet 1 tablet po q 12 hours prn pain (Patient taking differently: Take 550 mg by mouth 2 (two) times daily as needed (for menstrual pains.). ) 30 tablet 2  . norethindrone-ethinyl estradiol (JUNEL FE 1/20) 1-20 MG-MCG tablet Take 1 tablet by mouth daily. 3 Package 3   No  current facility-administered medications for this visit.      ALLERGIES: Patient has no known allergies.  Family History  Problem Relation Age of Onset  . Multiple myeloma Maternal Grandmother   . Heart disease Maternal Grandfather        heart stents  . Graves' disease Mother     Social History   Socioeconomic History  . Marital status: Married    Spouse name: Not on file  . Number of children: Not on file  . Years of education: Not on file  . Highest education level: Not on file  Occupational History  . Not on file  Social Needs  . Financial resource strain: Not on file  . Food insecurity    Worry: Not on file    Inability: Not on file  . Transportation needs    Medical: Not on file    Non-medical: Not on file  Tobacco Use  . Smoking status: Never Smoker  . Smokeless tobacco: Never Used  Substance and Sexual Activity  . Alcohol use: Yes    Alcohol/week: 3.0 - 5.0 standard drinks    Types: 3 - 5 Glasses of  wine per week  . Drug use: No  . Sexual activity: Yes    Partners: Male    Birth control/protection: Pill  Lifestyle  . Physical activity    Days per week: Not on file    Minutes per session: Not on file  . Stress: Not on file  Relationships  . Social Herbalist on phone: Not on file    Gets together: Not on file    Attends religious service: Not on file    Active member of club or organization: Not on file    Attends meetings of clubs or organizations: Not on file    Relationship status: Not on file  . Intimate partner violence    Fear of current or ex partner: Not on file    Emotionally abused: Not on file    Physically abused: Not on file    Forced sexual activity: Not on file  Other Topics Concern  . Not on file  Social History Narrative  . Not on file    Review of Systems  Constitutional: Negative.   HENT: Negative.   Eyes: Negative.   Respiratory: Negative.   Cardiovascular: Negative.   Gastrointestinal: Negative.    Genitourinary: Negative.   Musculoskeletal: Negative.   Skin: Negative.   Neurological: Negative.   Endo/Heme/Allergies: Negative.   Psychiatric/Behavioral: Negative.     PHYSICAL EXAMINATION:    BP 120/72 (BP Location: Right Arm, Patient Position: Sitting, Cuff Size: Normal)   Pulse 72   Temp 98.3 F (36.8 C) (Skin)   Wt 141 lb (64 kg)   LMP 01/24/2019   BMI 24.78 kg/m     General appearance: alert, cooperative and appears stated age   Pelvic: External genitalia:  no lesions              Urethra:  normal appearing urethra with no masses, tenderness or lesions              Bartholins and Skenes: normal                 Vagina: normal appearing vagina with normal color and discharge, no lesions              Cervix:  No gross lesions  Colposcopy: unsatisfactory, mild aceto-white changes, biopsy taken at 4 o'clock (suspect metaplasia), ECC done. Bleeding stopped with Monsel solution.   Chaperone was present for exam.  ASSESSMENT +HPV on pap. Reviewed pap results, hpv and h/o cervical dysplasia H/O cervical dysplasia    PLAN Colposcopy with biopsy and ECC done Further plan depending on results 2nd gardasil today   An After Visit Summary was printed and given to the patient.

## 2019-03-04 ENCOUNTER — Other Ambulatory Visit: Payer: Self-pay

## 2019-03-04 ENCOUNTER — Encounter: Payer: Self-pay | Admitting: Obstetrics and Gynecology

## 2019-03-04 ENCOUNTER — Ambulatory Visit: Payer: Managed Care, Other (non HMO) | Admitting: Obstetrics and Gynecology

## 2019-03-04 VITALS — BP 120/72 | HR 72 | Temp 98.3°F | Wt 141.0 lb

## 2019-03-04 DIAGNOSIS — Z01812 Encounter for preprocedural laboratory examination: Secondary | ICD-10-CM

## 2019-03-04 DIAGNOSIS — Z23 Encounter for immunization: Secondary | ICD-10-CM

## 2019-03-04 DIAGNOSIS — B977 Papillomavirus as the cause of diseases classified elsewhere: Secondary | ICD-10-CM | POA: Diagnosis not present

## 2019-03-04 LAB — POCT URINE PREGNANCY: Preg Test, Ur: NEGATIVE

## 2019-03-04 NOTE — Patient Instructions (Signed)

## 2019-05-29 ENCOUNTER — Other Ambulatory Visit: Payer: Self-pay

## 2019-05-29 DIAGNOSIS — Z20822 Contact with and (suspected) exposure to covid-19: Secondary | ICD-10-CM

## 2019-05-31 LAB — NOVEL CORONAVIRUS, NAA: SARS-CoV-2, NAA: NOT DETECTED

## 2019-06-02 ENCOUNTER — Other Ambulatory Visit: Payer: Self-pay

## 2019-06-02 DIAGNOSIS — Z20822 Contact with and (suspected) exposure to covid-19: Secondary | ICD-10-CM

## 2019-06-03 LAB — NOVEL CORONAVIRUS, NAA: SARS-CoV-2, NAA: NOT DETECTED

## 2019-06-06 ENCOUNTER — Other Ambulatory Visit: Payer: Self-pay

## 2019-06-06 DIAGNOSIS — Z20822 Contact with and (suspected) exposure to covid-19: Secondary | ICD-10-CM

## 2019-06-07 LAB — NOVEL CORONAVIRUS, NAA: SARS-CoV-2, NAA: NOT DETECTED

## 2019-06-11 ENCOUNTER — Other Ambulatory Visit: Payer: Self-pay | Admitting: *Deleted

## 2019-06-11 DIAGNOSIS — Z20822 Contact with and (suspected) exposure to covid-19: Secondary | ICD-10-CM

## 2019-06-12 LAB — NOVEL CORONAVIRUS, NAA: SARS-CoV-2, NAA: NOT DETECTED

## 2019-06-17 ENCOUNTER — Other Ambulatory Visit: Payer: Self-pay

## 2019-06-17 DIAGNOSIS — Z20822 Contact with and (suspected) exposure to covid-19: Secondary | ICD-10-CM

## 2019-06-19 LAB — NOVEL CORONAVIRUS, NAA: SARS-CoV-2, NAA: NOT DETECTED

## 2019-08-18 ENCOUNTER — Other Ambulatory Visit: Payer: Self-pay

## 2019-08-18 DIAGNOSIS — Z20822 Contact with and (suspected) exposure to covid-19: Secondary | ICD-10-CM

## 2019-08-19 LAB — NOVEL CORONAVIRUS, NAA: SARS-CoV-2, NAA: NOT DETECTED

## 2019-09-23 ENCOUNTER — Ambulatory Visit: Payer: Managed Care, Other (non HMO) | Attending: Internal Medicine

## 2019-09-23 ENCOUNTER — Other Ambulatory Visit: Payer: Self-pay

## 2019-09-23 DIAGNOSIS — Z20822 Contact with and (suspected) exposure to covid-19: Secondary | ICD-10-CM

## 2019-09-24 LAB — NOVEL CORONAVIRUS, NAA: SARS-CoV-2, NAA: NOT DETECTED

## 2019-11-14 ENCOUNTER — Telehealth: Payer: Self-pay | Admitting: Obstetrics and Gynecology

## 2019-11-14 NOTE — Telephone Encounter (Signed)
Appointment Request From: Claris Gower    With Provider: Romualdo Bolk, MD [facility]]    Preferred Date Range: Any date 12/24/2019 or later    Preferred Times: Any Time    Reason for visit: Office Visit    Comments:  Birth control, anxiety medicine

## 2019-11-14 NOTE — Telephone Encounter (Signed)
Left message for pt to call back to triage RN to schedule OV.

## 2019-11-17 NOTE — Telephone Encounter (Signed)
Spoke to pt. Pt states wanting to discuss new birth control options like IUDs and talk about her anxiety meds. Pt has hx of abnormal pap and colpo, but negative LEEP in 02/2019.Pt scheduled Mychart video visit for 11/25/2019 at 8 am with Dr Oscar La. Pt agreeable and instructed how to use video visit.  Pt also scheduled AEX with Dr Oscar La on 03/04/2020 at 4pm. Pt agreeable and verbalized understanding.   Routing to Dr Oscar La for review and will close encounter.

## 2019-11-25 ENCOUNTER — Encounter: Payer: Self-pay | Admitting: Obstetrics and Gynecology

## 2019-11-25 ENCOUNTER — Telehealth (INDEPENDENT_AMBULATORY_CARE_PROVIDER_SITE_OTHER): Payer: Managed Care, Other (non HMO) | Admitting: Obstetrics and Gynecology

## 2019-11-25 DIAGNOSIS — Z3009 Encounter for other general counseling and advice on contraception: Secondary | ICD-10-CM

## 2019-11-25 DIAGNOSIS — Z79899 Other long term (current) drug therapy: Secondary | ICD-10-CM | POA: Diagnosis not present

## 2019-11-25 DIAGNOSIS — Z8659 Personal history of other mental and behavioral disorders: Secondary | ICD-10-CM | POA: Diagnosis not present

## 2019-11-25 NOTE — Progress Notes (Signed)
Virtual Visit via Video Note  I connected with Mackenzie Schroeder on 11/25/19 at  8:00 AM EDT by a video enabled telemedicine application and verified that I am speaking with the correct person using two identifiers.  Location: Patient: Home Provider: Office at [facility]   I discussed the limitations of evaluation and management by telemedicine and the availability of in person appointments. The patient expressed understanding and agreed to proceed.  GYNECOLOGY  VISIT   HPI: 24 y.o.   Married White or Caucasian Not Hispanic or Latino  female   G0P0000 with No LMP recorded. (Menstrual status: Oral contraceptives).   A video visit to discuss contraception. She doesn't want to take the hormones. Prior to OCP's her cycles were monthly x5-7 days heavy with severe cramps. She was on OCP's through college, went off at 70 and then started again 2 years ago.   She is on Celexa and wants to get off of it. She is on it for anxiety which is well controlled. She has changed her diet, she is taking more organic supplements and feels she is ready to come off of the Celexa.   GYNECOLOGIC HISTORY: No LMP recorded. (Menstrual status: Oral contraceptives). Contraception: OCP's Menopausal hormone therapy: none        OB History    Gravida  0   Para  0   Term  0   Preterm  0   AB  0   Living  0     SAB  0   TAB  0   Ectopic  0   Multiple  0   Live Births                 There are no problems to display for this patient.   Past Medical History:  Diagnosis Date  . Abnormal Pap smear of cervix   . ADD (attention deficit disorder)    no meds currently  . Anxiety   . History of mononucleosis 12/13    Past Surgical History:  Procedure Laterality Date  . COLPOSCOPY N/A 03/05/2018   Procedure: COLPOSCOPY;  Surgeon: Salvadore Dom, MD;  Location: [facility];  Service: Gynecology;  Laterality: N/A;  . LEEP N/A 03/05/2018   Procedure: LOOP ELECTROSURGICAL  EXCISION PROCEDURE (LEEP) with ECC;  Surgeon: Salvadore Dom, MD;  Location: [facility];  Service: Gynecology;  Laterality: N/A;  with ECC  . WISDOM TOOTH EXTRACTION      Current Outpatient Medications  Medication Sig Dispense Refill  . citalopram (CELEXA) 10 MG tablet Take 1 tablet (10 mg total) by mouth daily. 90 tablet 3  . citalopram (CELEXA) 20 MG tablet Take one tablet daily, take with the 10 mg tablet for a total dose of 30 mg a day 90 tablet 3  . naproxen sodium (ANAPROX DS) 550 MG tablet 1 tablet po q 12 hours prn pain (Patient taking differently: Take 550 mg by mouth 2 (two) times daily as needed (for menstrual pains.). ) 30 tablet 2  . norethindrone-ethinyl estradiol (JUNEL FE 1/20) 1-20 MG-MCG tablet Take 1 tablet by mouth daily. 3 Package 3   No current facility-administered medications for this visit.     ALLERGIES: Patient has no known allergies.  Family History  Problem Relation Age of Onset  . Multiple myeloma Maternal Grandmother   . Heart disease Maternal Grandfather        heart stents  . Graves' disease Mother     Social History  Socioeconomic History  . Marital status: Married    Spouse name: Not on file  . Number of children: Not on file  . Years of education: Not on file  . Highest education level: Not on file  Occupational History  . Not on file  Tobacco Use  . Smoking status: Never Smoker  . Smokeless tobacco: Never Used  Substance and Sexual Activity  . Alcohol use: Yes    Alcohol/week: 3.0 - 5.0 standard drinks    Types: 3 - 5 Glasses of wine per week  . Drug use: No  . Sexual activity: Yes    Partners: Male    Birth control/protection: Pill  Other Topics Concern  . Not on file  Social History Narrative  . Not on file   Social Determinants of Health   Financial Resource Strain:   . Difficulty of Paying Living Expenses:   Food Insecurity:   . Worried About Charity fundraiser in the Last Year:   . Arboriculturist in the Last Year:    Transportation Needs:   . Film/video editor (Medical):   Marland Kitchen Lack of Transportation (Non-Medical):   Physical Activity:   . Days of Exercise per Week:   . Minutes of Exercise per Session:   Stress:   . Feeling of Stress :   Social Connections:   . Frequency of Communication with Friends and Family:   . Frequency of Social Gatherings with Friends and Family:   . Attends Religious Services:   . Active Member of Clubs or Organizations:   . Attends Archivist Meetings:   Marland Kitchen Marital Status:   Intimate Partner Violence:   . Fear of Current or Ex-Partner:   . Emotionally Abused:   Marland Kitchen Physically Abused:   . Sexually Abused:     Review of Systems  All other systems reviewed and are negative.   PHYSICAL EXAMINATION:    There were no vitals taken for this visit.    General appearance: alert, cooperative and appears stated age  ASSESSMENT Contraception counseling. Discussed all of her options of contraception. She has a h/o severe dysmenorrhea and heavy cycles off of OCP's.  Anxiety, she is doing well and would like to try to come off of the Celexa. She is currently on 20 mg a day.    PLAN After review of her options the patient would like to have a mirena IUD inserted. Reviewed the risks of insertion and side effects of the mirena. Will schedule mirena IUD insertion, it can be an any time in her cycle (on OCP's). Discussed that she will need back up contraception for one week. Discussed weaning off of the Celexa. She will go down to 10 mg of Celexa for 2 weeks, if doing well she will then stop the Celexa.     I provided 24 minutes of total patient care.  Salvadore Dom, MD

## 2019-12-03 ENCOUNTER — Telehealth: Payer: Self-pay | Admitting: Obstetrics and Gynecology

## 2019-12-03 NOTE — Telephone Encounter (Signed)
Call placed to convey benefits for Mirena insertion. °

## 2019-12-04 NOTE — Telephone Encounter (Signed)
Spoke with patient regarding benefits for Mirena IUD insertion. Patient acknowledges understanding of information presented. Patient is aware of cancellation policy. Patient scheduled for 12/17/2019. Encounter closed.

## 2019-12-16 ENCOUNTER — Other Ambulatory Visit: Payer: Self-pay

## 2019-12-17 ENCOUNTER — Encounter: Payer: Self-pay | Admitting: Obstetrics and Gynecology

## 2019-12-17 ENCOUNTER — Ambulatory Visit (INDEPENDENT_AMBULATORY_CARE_PROVIDER_SITE_OTHER): Payer: Managed Care, Other (non HMO) | Admitting: Obstetrics and Gynecology

## 2019-12-17 VITALS — BP 114/68 | HR 84 | Temp 97.7°F | Resp 10 | Ht 63.0 in | Wt 143.0 lb

## 2019-12-17 DIAGNOSIS — Z113 Encounter for screening for infections with a predominantly sexual mode of transmission: Secondary | ICD-10-CM

## 2019-12-17 DIAGNOSIS — Z01812 Encounter for preprocedural laboratory examination: Secondary | ICD-10-CM | POA: Diagnosis not present

## 2019-12-17 DIAGNOSIS — Z3009 Encounter for other general counseling and advice on contraception: Secondary | ICD-10-CM

## 2019-12-17 DIAGNOSIS — Z30430 Encounter for insertion of intrauterine contraceptive device: Secondary | ICD-10-CM | POA: Diagnosis not present

## 2019-12-17 LAB — POCT URINE PREGNANCY: Preg Test, Ur: NEGATIVE

## 2019-12-17 NOTE — Patient Instructions (Signed)
IUD Post-procedure Instructions Cramping is common.  You may take Ibuprofen, Aleve, or Tylenol for the cramping.  This should resolve within 24 hours.   You may have a small amount of spotting.  You should wear a mini pad for the next few days. You may have intercourse in 24 hours. You need to call the office if you have any pelvic pain, fever, heavy bleeding, or foul smelling vaginal discharge. Shower or bathe as normal Use back up contraception for one week 

## 2019-12-17 NOTE — Progress Notes (Signed)
;GYNECOLOGY  VISIT   HPI: 24 y.o.   Married White or Caucasian Not Hispanic or Latino  female   G0P0000 with Patient's last menstrual period was 12/12/2019.   here for IUD insertion     GYNECOLOGIC HISTORY: Patient's last menstrual period was 12/12/2019. Contraception:none Menopausal hormone therapy: n/a        OB History    Gravida  0   Para  0   Term  0   Preterm  0   AB  0   Living  0     SAB  0   TAB  0   Ectopic  0   Multiple  0   Live Births                 There are no problems to display for this patient.   Past Medical History:  Diagnosis Date  . Abnormal Pap smear of cervix   . ADD (attention deficit disorder)    no meds currently  . Anxiety   . History of mononucleosis 12/13    Past Surgical History:  Procedure Laterality Date  . COLPOSCOPY N/A 03/05/2018   Procedure: COLPOSCOPY;  Surgeon: Salvadore Dom, MD;  Location: [facility];  Service: Gynecology;  Laterality: N/A;  . LEEP N/A 03/05/2018   Procedure: LOOP ELECTROSURGICAL EXCISION PROCEDURE (LEEP) with ECC;  Surgeon: Salvadore Dom, MD;  Location: [facility];  Service: Gynecology;  Laterality: N/A;  with ECC  . WISDOM TOOTH EXTRACTION      Current Outpatient Medications  Medication Sig Dispense Refill  . naproxen sodium (ANAPROX) 550 MG tablet Take 550 mg by mouth as needed.     No current facility-administered medications for this visit.     ALLERGIES: Patient has no known allergies.  Family History  Problem Relation Age of Onset  . Multiple myeloma Maternal Grandmother   . Heart disease Maternal Grandfather        heart stents  . Graves' disease Mother     Social History   Socioeconomic History  . Marital status: Married    Spouse name: Not on file  . Number of children: Not on file  . Years of education: Not on file  . Highest education level: Not on file  Occupational History  . Not on file  Tobacco Use  . Smoking status: Never Smoker  . Smokeless  tobacco: Never Used  Substance and Sexual Activity  . Alcohol use: Yes    Alcohol/week: 3.0 - 5.0 standard drinks    Types: 3 - 5 Glasses of wine per week  . Drug use: No  . Sexual activity: Yes    Partners: Male    Birth control/protection: Pill  Other Topics Concern  . Not on file  Social History Narrative  . Not on file   Social Determinants of Health   Financial Resource Strain:   . Difficulty of Paying Living Expenses:   Food Insecurity:   . Worried About Charity fundraiser in the Last Year:   . Arboriculturist in the Last Year:   Transportation Needs:   . Film/video editor (Medical):   Marland Kitchen Lack of Transportation (Non-Medical):   Physical Activity:   . Days of Exercise per Week:   . Minutes of Exercise per Session:   Stress:   . Feeling of Stress :   Social Connections:   . Frequency of Communication with Friends and Family:   . Frequency of Social Gatherings with Friends  and Family:   . Attends Religious Services:   . Active Member of Clubs or Organizations:   . Attends Archivist Meetings:   Marland Kitchen Marital Status:   Intimate Partner Violence:   . Fear of Current or Ex-Partner:   . Emotionally Abused:   Marland Kitchen Physically Abused:   . Sexually Abused:     Review of Systems  Constitutional: Negative.   HENT: Negative.   Eyes: Negative.   Respiratory: Negative.   Cardiovascular: Negative.   Gastrointestinal: Negative.   Genitourinary: Negative.   Musculoskeletal: Negative.   Skin: Negative.   Neurological: Negative.   Endo/Heme/Allergies: Negative.   Psychiatric/Behavioral: Negative.     PHYSICAL EXAMINATION:    BP 114/68 (BP Location: Right Arm, Patient Position: Sitting, Cuff Size: Normal)   Pulse 84   Temp 97.7 F (36.5 C) (Temporal)   Resp 10   Ht 5' 3"  (1.6 m)   Wt 143 lb (64.9 kg)   LMP 12/12/2019   BMI 25.33 kg/m     General appearance: alert, cooperative and appears stated age  Pelvic: External genitalia:  no lesions               Urethra:  normal appearing urethra with no masses, tenderness or lesions              Bartholins and Skenes: normal                 Vagina: normal appearing vagina with normal color and discharge, no lesions              Cervix: no lesions              Bimanual Exam:  Uterus:  normal size, contour, position, consistency, mobility, non-tender              Adnexa: no mass, fullness, tenderness              The risks of the mirena IUD were reviewed with the patient, including infection, abnormal bleeding and uterine perfortion. Consent was signed.  A speculum was placed in the vagina, the cervix was cleansed with betadine. A tenaculum was placed on the cervix, the uterus sounded to 6 cm. The cervix was dilated to a 5 hagar dilator  The mirena IUD was inserted without difficulty. The string were cut to 3-4 cm. The tenaculum was removed.   The patient tolerated the procedure well.    Chaperone was present for exam.  ASSESSMENT IUD insertion    PLAN F/U in one month   An After Visit Summary was printed and given to the patient.

## 2019-12-18 LAB — GC/CHLAMYDIA PROBE AMP
Chlamydia trachomatis, NAA: NEGATIVE
Neisseria Gonorrhoeae by PCR: NEGATIVE

## 2019-12-25 ENCOUNTER — Other Ambulatory Visit: Payer: Self-pay | Admitting: Obstetrics and Gynecology

## 2020-03-03 ENCOUNTER — Other Ambulatory Visit: Payer: Self-pay

## 2020-03-04 ENCOUNTER — Ambulatory Visit: Payer: Managed Care, Other (non HMO) | Admitting: Obstetrics and Gynecology

## 2020-03-04 ENCOUNTER — Other Ambulatory Visit (HOSPITAL_COMMUNITY)
Admission: RE | Admit: 2020-03-04 | Discharge: 2020-03-04 | Disposition: A | Discharge status: Home / Self Care | Payer: Managed Care, Other (non HMO) | Source: Ambulatory Visit | Attending: Obstetrics and Gynecology | Admitting: Obstetrics and Gynecology

## 2020-03-04 ENCOUNTER — Encounter: Payer: Self-pay | Admitting: Obstetrics and Gynecology

## 2020-03-04 VITALS — BP 118/76 | HR 68 | Temp 98.1°F | Ht 63.25 in | Wt 141.0 lb

## 2020-03-04 DIAGNOSIS — Z0000 Encounter for general adult medical examination without abnormal findings: Secondary | ICD-10-CM | POA: Diagnosis not present

## 2020-03-04 DIAGNOSIS — Z124 Encounter for screening for malignant neoplasm of cervix: Secondary | ICD-10-CM | POA: Insufficient documentation

## 2020-03-04 DIAGNOSIS — Z23 Encounter for immunization: Secondary | ICD-10-CM | POA: Diagnosis not present

## 2020-03-04 DIAGNOSIS — Z01419 Encounter for gynecological examination (general) (routine) without abnormal findings: Secondary | ICD-10-CM | POA: Diagnosis not present

## 2020-03-04 NOTE — Progress Notes (Signed)
24 y.o. G0P0000 Married White or Caucasian Not Hispanic or Latino female here for annual exam. Patient states that after getting her mirea IUD in April, 2021 she bled until May but then had not had any blood until yesterday. She reports it to be dark brown and very light. No dyspareunia. No cramps.    No LMP recorded. (Menstrual status: Oral contraceptives).          Sexually active: Yes.    The current method of family planning is IUD.    Exercising: Yes.    running  Smoker:  no  Health Maintenance: Pap: 02/19/19 WNL HPV +: Colposcopy unsatisfactory with negative biopsy and ECC. 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 She has never used smokeless tobacco. She reports current alcohol use of about 3.0 - 5.0 standard drinks of alcohol per week. She reports that she does not use drugs. 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 Abnormal Pap smear of cervix   . ADD (attention deficit disorder)    no meds currently  . Anxiety   . History of mononucleosis 12/13    Past Surgical History:  Procedure Laterality Date  . COLPOSCOPY N/A 03/05/2018   Procedure: COLPOSCOPY;  Surgeon: Salvadore Dom, MD;  Location: [facility];  Service: Gynecology;  Laterality: N/A;  . LEEP N/A 03/05/2018   Procedure: LOOP ELECTROSURGICAL EXCISION PROCEDURE (LEEP) with ECC;  Surgeon: Salvadore Dom, MD;  Location: [facility];  Service: Gynecology;  Laterality: N/A;  with ECC  . WISDOM TOOTH EXTRACTION      Current Outpatient Medications  Medication Sig Dispense Refill  . naproxen sodium (ANAPROX) 550 MG tablet Take 550 mg by mouth as needed.     No current facility-administered medications for this visit.    Family History  Problem Relation Age of Onset  . Multiple myeloma Maternal Grandmother   . Heart disease  Maternal Grandfather        heart stents  . Graves' disease Mother     Review of Systems  Genitourinary: Positive for vaginal bleeding.  All other systems reviewed and are negative.   Exam:   There were no vitals taken for this visit.  Weight change: @WEIGHTCHANGE @ Height:      Ht Readings from Last 3 Encounters:  12/17/19 5' 3"  (1.6 m)  02/19/19 5' 3.25" (1.607 m)  03/05/18 5' 2"  (1.575 m)    General appearance: alert, cooperative and appears stated age Head: Normocephalic, without obvious abnormality, atraumatic Neck: no adenopathy, supple, symmetrical, trachea midline and thyroid normal to inspection and palpation Lungs: clear to auscultation bilaterally Cardiovascular: regular rate and rhythm Breasts: normal appearance, no masses or tenderness Abdomen: soft, non-tender; non distended,  no masses,  no organomegaly Extremities: extremities normal, atraumatic, no cyanosis or edema Skin: Skin color, texture, turgor normal. No rashes or lesions Lymph nodes: Cervical, supraclavicular, and axillary nodes normal. No abnormal inguinal nodes palpated Neurologic: Grossly normal   Pelvic: External genitalia:  no lesions              Urethra:  normal appearing urethra with no masses, tenderness or lesions              Bartholins and Skenes: normal  Vagina: normal appearing vagina with normal color and discharge, no lesions              Cervix: no lesions, IUD string 3 cm               Bimanual Exam:  Uterus:  normal size, contour, position, consistency, mobility, non-tender              Adnexa: no mass, fullness, tenderness               Rectovaginal: Confirms               Anus:  normal sphincter tone, no lesions  Gae Dry chaperoned for the exam.  A:  Well Woman with normal exam  IUD check  H/O leep for cervical dysplasia  P:   Pap with hpv  Declines STD testing  3rd gardasil shot today  Discussed breast self exam  Discussed calcium and vit D  intake

## 2020-03-04 NOTE — Patient Instructions (Signed)
EXERCISE AND DIET:  We recommended that you start or continue a regular exercise program for good health. Regular exercise means any activity that makes your heart beat faster and makes you sweat.  We recommend exercising at least 30 minutes per day at least 3 days a week, preferably 4 or 5.  We also recommend a diet low in fat and sugar.  Inactivity, poor dietary choices and obesity can cause diabetes, heart attack, stroke, and kidney damage, among others.    ALCOHOL AND SMOKING:  Women should limit their alcohol intake to no more than 7 drinks/beers/glasses of wine (combined, not each!) per week. Moderation of alcohol intake to this level decreases your risk of breast cancer and liver damage. And of course, no recreational drugs are part of a healthy lifestyle.  And absolutely no smoking or even second hand smoke. Most people know smoking can cause heart and lung diseases, but did you know it also contributes to weakening of your bones? Aging of your skin?  Yellowing of your teeth and nails?  CALCIUM AND VITAMIN D:  Adequate intake of calcium and Vitamin D are recommended.  The recommendations for exact amounts of these supplements seem to change often, but generally speaking 1,000 mg of calcium (between diet and supplement) and 800 units of Vitamin D per day seems prudent. Certain women may benefit from higher intake of Vitamin D.  If you are among these women, your doctor will have told you during your visit.    PAP SMEARS:  Pap smears, to check for cervical cancer or precancers,  have traditionally been done yearly, although recent scientific advances have shown that most women can have pap smears less often.  However, every woman still should have a physical exam from her gynecologist every year. It will include a breast check, inspection of the vulva and vagina to check for abnormal growths or skin changes, a visual exam of the cervix, and then an exam to evaluate the size and shape of the uterus and  ovaries.  And after 25 years of age, a rectal exam is indicated to check for rectal cancers. We will also provide age appropriate advice regarding health maintenance, like when you should have certain vaccines, screening for sexually transmitted diseases, bone density testing, colonoscopy, mammograms, etc.   MAMMOGRAMS:  All women over 40 years old should have a yearly mammogram. Many facilities now offer a "3D" mammogram, which may cost around $50 extra out of pocket. If possible,  we recommend you accept the option to have the 3D mammogram performed.  It both reduces the number of women who will be called back for extra views which then turn out to be normal, and it is better than the routine mammogram at detecting truly abnormal areas.    COLON CANCER SCREENING: Now recommend starting at age 45. At this time colonoscopy is not covered for routine screening until 50. There are take home tests that can be done between 45-49.   COLONOSCOPY:  Colonoscopy to screen for colon cancer is recommended for all women at age 50.  We know, you hate the idea of the prep.  We agree, BUT, having colon cancer and not knowing it is worse!!  Colon cancer so often starts as a polyp that can be seen and removed at colonscopy, which can quite literally save your life!  And if your first colonoscopy is normal and you have no family history of colon cancer, most women don't have to have it again for   10 years.  Once every ten years, you can do something that may end up saving your life, right?  We will be happy to help you get it scheduled when you are ready.  Be sure to check your insurance coverage so you understand how much it will cost.  It may be covered as a preventative service at no cost, but you should check your particular policy.      Breast Self-Awareness Breast self-awareness means being familiar with how your breasts look and feel. It involves checking your breasts regularly and reporting any changes to your  health care provider. Practicing breast self-awareness is important. A change in your breasts can be a sign of a serious medical problem. Being familiar with how your breasts look and feel allows you to find any problems early, when treatment is more likely to be successful. All women should practice breast self-awareness, including women who have had breast implants. How to do a breast self-exam One way to learn what is normal for your breasts and whether your breasts are changing is to do a breast self-exam. To do a breast self-exam: Look for Changes  1. Remove all the clothing above your waist. 2. Stand in front of a mirror in a room with good lighting. 3. Put your hands on your hips. 4. Push your hands firmly downward. 5. Compare your breasts in the mirror. Look for differences between them (asymmetry), such as: ? Differences in shape. ? Differences in size. ? Puckers, dips, and bumps in one breast and not the other. 6. Look at each breast for changes in your skin, such as: ? Redness. ? Scaly areas. 7. Look for changes in your nipples, such as: ? Discharge. ? Bleeding. ? Dimpling. ? Redness. ? A change in position. Feel for Changes Carefully feel your breasts for lumps and changes. It is best to do this while lying on your back on the floor and again while sitting or standing in the shower or tub with soapy water on your skin. Feel each breast in the following way:  Place the arm on the side of the breast you are examining above your head.  Feel your breast with the other hand.  Start in the nipple area and make  inch (2 cm) overlapping circles to feel your breast. Use the pads of your three middle fingers to do this. Apply light pressure, then medium pressure, then firm pressure. The light pressure will allow you to feel the tissue closest to the skin. The medium pressure will allow you to feel the tissue that is a little deeper. The firm pressure will allow you to feel the tissue  close to the ribs.  Continue the overlapping circles, moving downward over the breast until you feel your ribs below your breast.  Move one finger-width toward the center of the body. Continue to use the  inch (2 cm) overlapping circles to feel your breast as you move slowly up toward your collarbone.  Continue the up and down exam using all three pressures until you reach your armpit.  Write Down What You Find  Write down what is normal for each breast and any changes that you find. Keep a written record with breast changes or normal findings for each breast. By writing this information down, you do not need to depend only on memory for size, tenderness, or location. Write down where you are in your menstrual cycle, if you are still menstruating. If you are having trouble noticing differences   in your breasts, do not get discouraged. With time you will become more familiar with the variations in your breasts and more comfortable with the exam. How often should I examine my breasts? Examine your breasts every month. If you are breastfeeding, the best time to examine your breasts is after a feeding or after using a breast pump. If you menstruate, the best time to examine your breasts is 5-7 days after your period is over. During your period, your breasts are lumpier, and it may be more difficult to notice changes. When should I see my health care provider? See your health care provider if you notice:  A change in shape or size of your breasts or nipples.  A change in the skin of your breast or nipples, such as a reddened or scaly area.  Unusual discharge from your nipples.  A lump or thick area that was not there before.  Pain in your breasts.  Anything that concerns you.  

## 2020-03-05 LAB — COMPREHENSIVE METABOLIC PANEL
ALT: 18 IU/L (ref 0–32)
AST: 19 IU/L (ref 0–40)
Albumin/Globulin Ratio: 1.6 (ref 1.2–2.2)
Albumin: 4.4 g/dL (ref 3.9–5.0)
Alkaline Phosphatase: 54 IU/L (ref 48–121)
BUN/Creatinine Ratio: 16 (ref 9–23)
BUN: 14 mg/dL (ref 6–20)
Bilirubin Total: 0.4 mg/dL (ref 0.0–1.2)
CO2: 25 mmol/L (ref 20–29)
Calcium: 9.1 mg/dL (ref 8.7–10.2)
Chloride: 101 mmol/L (ref 96–106)
Creatinine, Ser: 0.85 mg/dL (ref 0.57–1.00)
GFR calc Af Amer: 110 mL/min/{1.73_m2} (ref >59)
GFR calc non Af Amer: 96 mL/min/{1.73_m2} (ref >59)
Globulin, Total: 2.7 g/dL (ref 1.5–4.5)
Glucose: 97 mg/dL (ref 65–99)
Potassium: 3.8 mmol/L (ref 3.5–5.2)
Sodium: 139 mmol/L (ref 134–144)
Total Protein: 7.1 g/dL (ref 6.0–8.5)

## 2020-03-05 LAB — CBC
Hematocrit: 35.7 % (ref 34.0–46.6)
Hemoglobin: 12.5 g/dL (ref 11.1–15.9)
MCH: 32.1 pg (ref 26.6–33.0)
MCHC: 35 g/dL (ref 31.5–35.7)
MCV: 92 fL (ref 79–97)
Platelets: 184 10*3/uL (ref 150–450)
RBC: 3.89 x10E6/uL (ref 3.77–5.28)
RDW: 12.5 % (ref 11.7–15.4)
WBC: 4.8 10*3/uL (ref 3.4–10.8)

## 2020-03-05 LAB — LIPID PANEL
Chol/HDL Ratio: 3.6 ratio (ref 0.0–4.4)
Cholesterol, Total: 190 mg/dL (ref 100–199)
HDL: 53 mg/dL (ref >39)
LDL Chol Calc (NIH): 121 mg/dL — ABNORMAL HIGH (ref 0–99)
Triglycerides: 88 mg/dL (ref 0–149)
VLDL Cholesterol Cal: 16 mg/dL (ref 5–40)

## 2020-03-08 LAB — CYTOLOGY - PAP
Comment: NEGATIVE
Diagnosis: NEGATIVE
High risk HPV: NEGATIVE

## 2020-07-29 ENCOUNTER — Telehealth: Payer: Self-pay

## 2020-07-29 DIAGNOSIS — Z30432 Encounter for removal of intrauterine contraceptive device: Secondary | ICD-10-CM

## 2020-07-29 NOTE — Telephone Encounter (Signed)
Patient is calling in regards to IUD removal.  

## 2020-07-29 NOTE — Telephone Encounter (Signed)
AEX 02/2020 Mirena IUD placed 12/2019 LMP 07/25/20  Spoke with pt. Pt states would like to have IUD removed due to having increased acne. Pt states is seeing a dermatologist and is taking Rx Ampicillin for acne. Not working and the only thing changed was getting IUD insertion. Pt states would like to possibly go back on OCPs and will discuss at appt. Denies any other problems or concerns with IUD at this time. Denies abd pain, irregular bleeding.   Pt advised to have OV for IUD removal. Pt agreeable. Pt scheduled for 11/24 at 1 pm. Pt verbalized understanding to date and time of appt. Cc: Hayley for precert  Encounter closed

## 2020-08-04 ENCOUNTER — Encounter: Payer: Self-pay | Admitting: Obstetrics and Gynecology

## 2020-08-04 ENCOUNTER — Ambulatory Visit (INDEPENDENT_AMBULATORY_CARE_PROVIDER_SITE_OTHER): Payer: Managed Care, Other (non HMO) | Admitting: Obstetrics and Gynecology

## 2020-08-04 ENCOUNTER — Other Ambulatory Visit: Payer: Self-pay

## 2020-08-04 VITALS — BP 128/64 | HR 75 | Ht 63.25 in | Wt 138.8 lb

## 2020-08-04 DIAGNOSIS — Z30432 Encounter for removal of intrauterine contraceptive device: Secondary | ICD-10-CM

## 2020-08-04 DIAGNOSIS — L700 Acne vulgaris: Secondary | ICD-10-CM | POA: Diagnosis not present

## 2020-08-04 DIAGNOSIS — Z3009 Encounter for other general counseling and advice on contraception: Secondary | ICD-10-CM

## 2020-08-04 MED ORDER — DROSPIRENONE-ETHINYL ESTRADIOL 3-0.02 MG PO TABS
1.0000 | ORAL_TABLET | Freq: Every day | ORAL | 2 refills | Status: DC
Start: 2020-08-04 — End: 2021-03-09

## 2020-08-04 NOTE — Progress Notes (Signed)
GYNECOLOGY  VISIT   HPI: 24 y.o.   Married White or Caucasian Not Hispanic or Latino  female   G0P0000 with No LMP recorded. (Menstrual status: IUD).   here for IUD removal and [facility] consult. She had the mirena inserted in 4/21. Since the IUD was inserted she has had very bad acne, otherwise has done very with the mirena.  She has previously been on OCP's, wanted to get off of the hormones so she switched to the [place]. She would like to go back on OCP's  GYNECOLOGIC HISTORY: No LMP recorded. (Menstrual status: IUD). Contraception:IUD Menopausal hormone therapy: none        OB History    Gravida  0   Para  0   Term  0   Preterm  0   AB  0   Living  0     SAB  0   TAB  0   Ectopic  0   Multiple  0   Live Births                 There are no problems to display for this patient.   Past Medical History:  Diagnosis Date  . Abnormal Pap smear of cervix   . ADD (attention deficit disorder)    no meds currently  . Anxiety   . History of mononucleosis 12/13    Past Surgical History:  Procedure Laterality Date  . COLPOSCOPY N/A 03/05/2018   Procedure: COLPOSCOPY;  Surgeon: Salvadore Dom, MD;  Location: [place];  Service: Gynecology;  Laterality: N/A;  . LEEP N/A 03/05/2018   Procedure: LOOP ELECTROSURGICAL EXCISION PROCEDURE (LEEP) with ECC;  Surgeon: Salvadore Dom, MD;  Location: [place] [facility];  Service: Gynecology;  Laterality: N/A;  with ECC  . WISDOM TOOTH EXTRACTION      Current Outpatient Medications  Medication Sig Dispense Refill  . naproxen sodium (ANAPROX) 550 MG tablet Take 550 mg by mouth as needed.     No current facility-administered medications for this visit.     ALLERGIES: Patient has no known allergies.  Family History  Problem Relation Age of Onset  . Multiple myeloma Maternal Grandmother   . Heart disease Maternal Grandfather        heart stents  . Graves' disease Mother     Social History   Socioeconomic History  . Marital  status: Married    Spouse name: Not on file  . Number of children: Not on file  . Years of education: Not on file  . Highest education level: Not on file  Occupational History  . Not on file  Tobacco Use  . Smoking status: Never Smoker  . Smokeless tobacco: Never Used  Vaping Use  . Vaping Use: Never used  Substance and Sexual Activity  . Alcohol use: Yes    Alcohol/week: 3.0 - 5.0 standard drinks    Types: 3 - 5 Glasses of wine per week  . Drug use: No  . Sexual activity: Yes    Partners: Male    Birth control/protection: Pill  Other Topics Concern  . Not on file  Social History Narrative  . Not on file   Social Determinants of Health   Financial Resource Strain:   . Difficulty of Paying Living Expenses: Not on file  Food Insecurity:   . Worried About Charity fundraiser in the Last Year: Not on file  . Ran Out of Food in the Last Year: Not on file  Transportation  Needs:   . Lack of Transportation (Medical): Not on file  . Lack of Transportation (Non-Medical): Not on file  Physical Activity:   . Days of Exercise per Week: Not on file  . Minutes of Exercise per Session: Not on file  Stress:   . Feeling of Stress : Not on file  Social Connections:   . Frequency of Communication with Friends and Family: Not on file  . Frequency of Social Gatherings with Friends and Family: Not on file  . Attends Religious Services: Not on file  . Active Member of Clubs or Organizations: Not on file  . Attends Archivist Meetings: Not on file  . Marital Status: Not on file  Intimate Partner Violence:   . Fear of Current or Ex-Partner: Not on file  . Emotionally Abused: Not on file  . Physically Abused: Not on file  . Sexually Abused: Not on file    Review of Systems  All other systems reviewed and are negative.   PHYSICAL EXAMINATION:    There were no vitals taken for this visit.    General appearance: alert, cooperative and appears stated age  Pelvic: External  genitalia:  no lesions              Urethra:  normal appearing urethra with no masses, tenderness or lesions              Bartholins and Skenes: normal                 Vagina: normal appearing vagina with normal color and discharge, no lesions              Cervix: no lesions and IUD string 3 cm, IUD removed with ringed forceps.             Chaperone was present for exam.  ASSESSMENT Acne developed with the mirena IUD Contraception counseling. Discussed different options for OCP's, no contraindications, risks reviewed and information was given    PLAN IUD removed Start Yaz, use back up for one week

## 2020-08-04 NOTE — Patient Instructions (Signed)

## 2021-01-03 ENCOUNTER — Emergency Department (HOSPITAL_COMMUNITY)
Admission: EM | Admit: 2021-01-03 | Discharge: 2021-01-03 | Disposition: A | Discharge status: Home / Self Care | Payer: Managed Care, Other (non HMO) | Attending: Emergency Medicine | Admitting: Emergency Medicine

## 2021-01-03 ENCOUNTER — Other Ambulatory Visit: Payer: Self-pay

## 2021-01-03 ENCOUNTER — Emergency Department (HOSPITAL_COMMUNITY): Payer: Managed Care, Other (non HMO)

## 2021-01-03 ENCOUNTER — Encounter (HOSPITAL_COMMUNITY): Payer: Self-pay

## 2021-01-03 DIAGNOSIS — R1031 Right lower quadrant pain: Secondary | ICD-10-CM | POA: Insufficient documentation

## 2021-01-03 DIAGNOSIS — R109 Unspecified abdominal pain: Secondary | ICD-10-CM

## 2021-01-03 LAB — COMPREHENSIVE METABOLIC PANEL
ALT: 16 U/L (ref 0–44)
AST: 18 U/L (ref 15–41)
Albumin: 3.6 g/dL (ref 3.5–5.0)
Alkaline Phosphatase: 35 U/L — ABNORMAL LOW (ref 38–126)
Anion gap: 9 (ref 5–15)
BUN: 6 mg/dL (ref 6–20)
CO2: 23 mmol/L (ref 22–32)
Calcium: 8.9 mg/dL (ref 8.9–10.3)
Chloride: 103 mmol/L (ref 98–111)
Creatinine, Ser: 0.82 mg/dL (ref 0.44–1.00)
GFR, Estimated: >60 mL/min (ref >60)
Glucose, Bld: 97 mg/dL (ref 70–99)
Potassium: 3.9 mmol/L (ref 3.5–5.1)
Sodium: 135 mmol/L (ref 135–145)
Total Bilirubin: 0.4 mg/dL (ref 0.3–1.2)
Total Protein: 6.4 g/dL — ABNORMAL LOW (ref 6.5–8.1)

## 2021-01-03 LAB — CBC WITH DIFFERENTIAL/PLATELET
Abs Immature Granulocytes: 0.02 10*3/uL (ref 0.00–0.07)
Basophils Absolute: 0 10*3/uL (ref 0.0–0.1)
Basophils Relative: 0 %
Eosinophils Absolute: 0.2 10*3/uL (ref 0.0–0.5)
Eosinophils Relative: 4 %
HCT: 34.8 % — ABNORMAL LOW (ref 36.0–46.0)
Hemoglobin: 11.9 g/dL — ABNORMAL LOW (ref 12.0–15.0)
Immature Granulocytes: 0 %
Lymphocytes Relative: 25 %
Lymphs Abs: 1.5 10*3/uL (ref 0.7–4.0)
MCH: 31.4 pg (ref 26.0–34.0)
MCHC: 34.2 g/dL (ref 30.0–36.0)
MCV: 91.8 fL (ref 80.0–100.0)
Monocytes Absolute: 0.3 10*3/uL (ref 0.1–1.0)
Monocytes Relative: 5 %
Neutro Abs: 4.1 10*3/uL (ref 1.7–7.7)
Neutrophils Relative %: 66 %
Platelets: 172 10*3/uL (ref 150–400)
RBC: 3.79 MIL/uL — ABNORMAL LOW (ref 3.87–5.11)
RDW: 11.8 % (ref 11.5–15.5)
WBC: 6.2 10*3/uL (ref 4.0–10.5)
nRBC: 0 % (ref 0.0–0.2)

## 2021-01-03 LAB — URINALYSIS, ROUTINE W REFLEX MICROSCOPIC
Bilirubin Urine: NEGATIVE
Glucose, UA: NEGATIVE mg/dL
Ketones, ur: NEGATIVE mg/dL
Nitrite: NEGATIVE
Protein, ur: NEGATIVE mg/dL
Specific Gravity, Urine: 1.004 — ABNORMAL LOW (ref 1.005–1.030)
pH: 7 (ref 5.0–8.0)

## 2021-01-03 LAB — I-STAT BETA HCG BLOOD, ED (MC, WL, AP ONLY): I-stat hCG, quantitative: <5 m[IU]/mL (ref <5)

## 2021-01-03 LAB — LIPASE, BLOOD: Lipase: 33 U/L (ref 11–51)

## 2021-01-03 MED ORDER — IOHEXOL 300 MG/ML  SOLN
100.0000 mL | Freq: Once | INTRAMUSCULAR | Status: AC | PRN
  Administered 2021-01-03: 100 mL via INTRAVENOUS

## 2021-01-03 NOTE — ED Notes (Signed)
Vital signs stable. 

## 2021-01-03 NOTE — ED Notes (Signed)
Family updated as to patient's status.

## 2021-01-03 NOTE — ED Notes (Signed)
Patient is resting comfortably. 

## 2021-01-03 NOTE — ED Triage Notes (Signed)
Pt reports LLQ pain. Pt reports the pain was sudden onset.

## 2021-01-03 NOTE — Discharge Instructions (Signed)
Please return for any problem.   Use ibuprofen as instructed for pain control.

## 2021-01-03 NOTE — ED Notes (Signed)
Lab is rerunning Beta-HCG

## 2021-01-03 NOTE — ED Triage Notes (Signed)
Emergency Medicine Provider Triage Evaluation Note  Mackenzie Schroeder , a 25 y.o. female  was evaluated in triage.  Pt complains of abd pain.  Reports intermittent low back pain last week but developed acute onset of right lower quadrant tenderness today with some associated nausea and decreased appetite.    Review of Systems  Positive: RLQ pain, nausea Negative: Fever, dysuria, hematuria, vaginal bleeding  Physical Exam  BP 112/89 (BP Location: Left Arm)   Pulse 94   Temp 99.1 F (37.3 C) (Oral)   Resp 12   SpO2 98%  Gen:   Awake, no distress   HEENT:  Atraumatic  Resp:  Normal effort  Cardiac:  Normal rate  Abd:   Mild RLQ tenderness.  No CVA tenderness  MSK:   Moves extremities without difficulty, no midline spine tenderness Neuro:  Speech clear   Medical Decision Making  Medically screening exam initiated at 11:55 AM.  Appropriate orders placed.  Mackenzie Schroeder was informed that the remainder of the evaluation will be completed by another provider, this initial triage assessment does not replace that evaluation, and the importance of remaining in the ED until their evaluation is complete.  Clinical Impression  abd pain   Fayrene Helper, PA-C 01/03/21 1156

## 2021-01-03 NOTE — ED Provider Notes (Signed)
[facility] EMERGENCY DEPARTMENT Provider Note   CSN: [id number] Arrival date & time: 01/03/21  1101     History Chief Complaint  Patient presents with  . Abdominal Pain    Mackenzie Schroeder is a 25 y.o. female.  25 year old female with prior medical history as detailed below presents for evaluation.  Patient complains of right sided low back discomfort with radiation into the right anterior abdomen.  Patient's symptoms started several days prior.  Patient's pain was worse this morning so she came to the ED.  She denies associated nausea, vomiting, bowel movement change, urinary symptoms, or other specific complaint.  She reports that she actually feels better now upon evaluation.    The history is provided by the patient and medical records.  Abdominal Pain Pain location:  RLQ Pain quality: aching   Pain radiates to:  Does not radiate Pain severity:  Mild Onset quality:  Gradual Duration:  4 days Timing:  Constant Progression:  Waxing and waning Chronicity:  New      Past Medical History:  Diagnosis Date  . Abnormal Pap smear of cervix   . ADD (attention deficit disorder)    no meds currently  . Anxiety   . History of mononucleosis 12/13    There are no problems to display for this patient.   Past Surgical History:  Procedure Laterality Date  . COLPOSCOPY N/A 03/05/2018   Procedure: COLPOSCOPY;  Surgeon: Salvadore Dom, MD;  Location: [facility];  Service: Gynecology;  Laterality: N/A;  . LEEP N/A 03/05/2018   Procedure: LOOP ELECTROSURGICAL EXCISION PROCEDURE (LEEP) with ECC;  Surgeon: Salvadore Dom, MD;  Location: [facility];  Service: Gynecology;  Laterality: N/A;  with ECC  . WISDOM TOOTH EXTRACTION       OB History    Gravida  0   Para  0   Term  0   Preterm  0   AB  0   Living  0     SAB  0   IAB  0   Ectopic  0   Multiple  0   Live Births              Family History  Problem Relation Age of Onset  .  Multiple myeloma Maternal Grandmother   . Heart disease Maternal Grandfather        heart stents  . Graves' disease Mother     Social History   Tobacco Use  . Smoking status: Never Smoker  . Smokeless tobacco: Never Used  Vaping Use  . Vaping Use: Never used  Substance Use Topics  . Alcohol use: Yes    Alcohol/week: 3.0 - 5.0 standard drinks    Types: 3 - 5 Glasses of wine per week  . Drug use: No    Home Medications Prior to Admission medications   Medication Sig Start Date End Date Taking? Authorizing Provider  ampicillin (PRINCIPEN) 500 MG capsule Take by mouth. 07/18/20   [provider]  drospirenone-ethinyl estradiol (YAZ) 3-0.02 MG tablet Take 1 tablet by mouth daily. 08/04/20   Salvadore Dom, MD  naproxen sodium (ANAPROX) 550 MG tablet Take 550 mg by mouth as needed.    [provider]    Allergies    Patient has no known allergies.  Review of Systems   Review of Systems  Gastrointestinal: Positive for abdominal pain.  All other systems reviewed and are negative.   Physical Exam Updated Vital Signs BP 122/69 (  BP Location: Left Arm)   Pulse 86   Temp 99.1 F (37.3 C) (Oral)   Resp 16   Ht _0  (1.575 m)   Wt 61.2 kg   SpO2 99%   BMI 24.69 kg/m   Physical Exam Vitals and nursing note reviewed.  Constitutional:      General: She is not in acute distress.    Appearance: She is well-developed.  HENT:     Head: Normocephalic and atraumatic.  Eyes:     Conjunctiva/sclera: Conjunctivae normal.     Pupils: Pupils are equal, round, and reactive to light.  Cardiovascular:     Rate and Rhythm: Normal rate and regular rhythm.     Heart sounds: Normal heart sounds.  Pulmonary:     Effort: Pulmonary effort is normal. No respiratory distress.     Breath sounds: Normal breath sounds.  Abdominal:     General: There is no distension.     Palpations: Abdomen is soft.     Tenderness: There is abdominal tenderness in the right lower  quadrant.  Musculoskeletal:        General: No deformity. Normal range of motion.     Cervical back: Normal range of motion and neck supple.  Skin:    General: Skin is warm and dry.  Neurological:     Mental Status: She is alert and oriented to person, place, and time.     ED Results / Procedures / Treatments   Labs (all labs ordered are listed, but only abnormal results are displayed) Labs Reviewed  CBC WITH DIFFERENTIAL/PLATELET - Abnormal; Notable for the following components:      Result Value   RBC 3.79 (*)    Hemoglobin 11.9 (*)    HCT 34.8 (*)    All other components within normal limits  COMPREHENSIVE METABOLIC PANEL - Abnormal; Notable for the following components:   Total Protein 6.4 (*)    Alkaline Phosphatase 35 (*)    All other components within normal limits  URINALYSIS, ROUTINE W REFLEX MICROSCOPIC - Abnormal; Notable for the following components:   Color, Urine STRAW (*)    Specific Gravity, Urine 1.004 (*)    Hgb urine dipstick SMALL (*)    Leukocytes,Ua TRACE (*)    Bacteria, UA RARE (*)    All other components within normal limits  LIPASE, BLOOD  I-STAT BETA HCG BLOOD, ED ([facility], [facility], [facility] ONLY)    EKG None  Radiology CT Abdomen Pelvis W Contrast  Result Date: 01/03/2021 CLINICAL DATA:  And nausea today. Onset right lower quadrant tenderness the patient had abdominal and back pain last week. EXAM: CT ABDOMEN AND PELVIS WITH CONTRAST TECHNIQUE: Multidetector CT imaging of the abdomen and pelvis was performed using the standard protocol following bolus administration of intravenous contrast. CONTRAST:  100 mL OMNIPAQUE IOHEXOL 300 MG/ML  SOLN COMPARISON:  None. FINDINGS: Lower chest: Lung bases clear.  No pleural or pericardial effusion. Hepatobiliary: No focal liver abnormality is seen. No gallstones, gallbladder wall thickening, or biliary dilatation. Pancreas: Unremarkable. No pancreatic ductal dilatation or surrounding inflammatory changes. Spleen: Normal in  size without focal abnormality. Adrenals/Urinary Tract: The adrenal glands appear normal. The patient has 3 small foci of increased attenuation in the right renal collecting system which could be stones or early contrast excretion. The largest is 0.3 cm in diameter. There is no hydronephrosis on the right or left. No ureteral or urinary bladder stones. Stomach/Bowel: Stomach is within normal limits. Appendix appears normal. No evidence of  bowel wall thickening, distention, or inflammatory changes. Vascular/Lymphatic: No significant vascular findings are present. No enlarged abdominal or pelvic lymph nodes. Reproductive: Uterus and bilateral adnexa are unremarkable. Other: Small fat containing umbilical hernia noted. Musculoskeletal: Negative. IMPRESSION: No acute abnormality or finding to explain the patient's symptoms. Three small foci of increased attenuation in the right kidney may be small nonobstructing stones or could be due to early contrast excretion. Negative for ureteral stone or hydronephrosis. Electronically Signed   By: Inge Rise M.D.   On: 01/03/2021 15:21    Procedures Procedures   Medications Ordered in ED Medications - No data to display  ED Course  I have reviewed the triage vital signs and the nursing notes.  Pertinent labs & imaging results that were available during my care of the patient were reviewed by me and considered in my medical decision making (see chart for details).    MDM Rules/Calculators/A&P                          MDM  MSE complete  Mackenzie Schroeder was evaluated in Emergency Department on 01/03/2021 for the symptoms described in the history of present illness. She was evaluated in the context of the global COVID-19 pandemic, which necessitated consideration that the patient might be at risk for infection with the SARS-CoV-2 virus that causes COVID-19. Institutional protocols and algorithms that pertain to the evaluation of patients at risk for  COVID-19 are in a state of rapid change based on information released by regulatory bodies including the CDC and federal and state organizations. These policies and algorithms were followed during the patient's care in the ED.   Patient is presenting with complaint of right flank pain.  Patient's pain was significantly worse prior to arrival.  Upon arrival to the ED she is now comfortable.  Patient's presentation and provided history are perhaps most consistent with recently passed renal stone.  Patient with family history of renal colic.  Patient has never actually had renal stones previously as far she knows.  CT imaging suggest possibility of nonobstructing renal stones on the right.  She has no obvious obstructing stone on the right.  She has no hydronephrosis on the right.  At time of discharge she is comfortable.  She understands that her diagnosis is somewhat unclear.  She does understand that my best guess is that her pathology is a recently passed small kidney stone.  Importance of close follow-up is stressed.  Strict return precautions given understood.     Final Clinical Impression(s) / ED Diagnoses Final diagnoses:  Right flank pain    Rx / DC Orders ED Discharge Orders    None       Valarie Merino, MD 01/03/21 1544

## 2021-03-09 ENCOUNTER — Other Ambulatory Visit (HOSPITAL_COMMUNITY)
Admission: RE | Admit: 2021-03-09 | Discharge: 2021-03-09 | Disposition: A | Discharge status: Home / Self Care | Payer: Managed Care, Other (non HMO) | Source: Ambulatory Visit | Attending: Obstetrics and Gynecology | Admitting: Obstetrics and Gynecology

## 2021-03-09 ENCOUNTER — Ambulatory Visit (INDEPENDENT_AMBULATORY_CARE_PROVIDER_SITE_OTHER): Payer: Managed Care, Other (non HMO) | Admitting: Obstetrics and Gynecology

## 2021-03-09 ENCOUNTER — Encounter: Payer: Self-pay | Admitting: Obstetrics and Gynecology

## 2021-03-09 ENCOUNTER — Other Ambulatory Visit: Payer: Self-pay

## 2021-03-09 VITALS — BP 118/64 | HR 69 | Ht 63.0 in | Wt 138.8 lb

## 2021-03-09 DIAGNOSIS — Z3041 Encounter for surveillance of contraceptive pills: Secondary | ICD-10-CM | POA: Diagnosis not present

## 2021-03-09 DIAGNOSIS — Z862 Personal history of diseases of the blood and blood-forming organs and certain disorders involving the immune mechanism: Secondary | ICD-10-CM | POA: Diagnosis not present

## 2021-03-09 DIAGNOSIS — Z01419 Encounter for gynecological examination (general) (routine) without abnormal findings: Secondary | ICD-10-CM

## 2021-03-09 DIAGNOSIS — E7800 Pure hypercholesterolemia, unspecified: Secondary | ICD-10-CM

## 2021-03-09 DIAGNOSIS — Z124 Encounter for screening for malignant neoplasm of cervix: Secondary | ICD-10-CM | POA: Diagnosis present

## 2021-03-09 DIAGNOSIS — Z0000 Encounter for general adult medical examination without abnormal findings: Secondary | ICD-10-CM

## 2021-03-09 MED ORDER — DROSPIRENONE-ETHINYL ESTRADIOL 3-0.02 MG PO TABS: 1.0000 | ORAL_TABLET | Freq: Every day | ORAL | 3 refills | Status: DC

## 2021-03-09 NOTE — Progress Notes (Signed)
25 y.o. G0P0000 Married White or Caucasian Not Hispanic or Latino female here for annual exam.  On Yaz, she is taking it continuously most of the time. Her acne has resolved since the Mirena was removed and she started on Yaz. No dyspareunia.  Cramps are horrible for one day, but better on the pill than off the pill. She has missed work for her cramps in the past. Midol helps. At most she saturates a super tampon in 4 hours.  Period Cycle (Days): 90 Period Duration (Days): 3 Period Pattern: Regular Menstrual Flow: Moderate Menstrual Control: Tampon Menstrual Control Change Freq (Hours): 3-4 Dysmenorrhea: (!) Severe Dysmenorrhea Symptoms: Cramping, Headache, Diarrhea, Nausea Considering getting pregnant next year.   Patient's last menstrual period was 02/09/2021 (approximate).          Sexually active: Yes.    The current method of family planning is OCP (estrogen/progesterone).    Exercising: Yes.     Walking  Smoker:  no  Health Maintenance: Pap:  03/04/20 negative, negative hpv 02/19/19 WNL HPV +: Colposcopy unsatisfactory with negative biopsy and ECC. 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 She has never used smokeless tobacco. She reports current alcohol use of about 3.0 - 5.0 standard drinks of alcohol per week. She reports that she does not use drugs. Building a house, will be done next month.  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  Past Medical History:  Diagnosis Date   Abnormal Pap smear of cervix    ADD (attention deficit disorder)    no meds currently   Anxiety    History of mononucleosis 12/13    Past Surgical History:  Procedure Laterality Date   COLPOSCOPY N/A 03/05/2018   Procedure: COLPOSCOPY;  Surgeon: Salvadore Dom, MD;  Location: [facility];  Service:  Gynecology;  Laterality: N/A;   LEEP N/A 03/05/2018   Procedure: LOOP ELECTROSURGICAL EXCISION PROCEDURE (LEEP) with ECC;  Surgeon: Salvadore Dom, MD;  Location: [facility];  Service: Gynecology;  Laterality: N/A;  with ECC   WISDOM TOOTH EXTRACTION      Current Outpatient Medications  Medication Sig Dispense Refill   drospirenone-ethinyl estradiol (YAZ) 3-0.02 MG tablet Take 1 tablet by mouth daily. 84 tablet 2   No current facility-administered medications for this visit.    Family History  Problem Relation Age of Onset   Multiple myeloma Maternal Grandmother    Heart disease Maternal Grandfather        heart stents   Graves' disease Mother     Review of Systems  All other systems reviewed and are negative.  Exam:   BP 118/64   Pulse 69   Ht 5' 3"  (1.6 m)   Wt 138 lb 12.8 oz (63 kg)   LMP 02/09/2021 (Approximate)   SpO2 100%   BMI 24.59 kg/m   Weight change: @WEIGHTCHANGE @ Height:   Height: 5' 3"  (160 cm)  Ht Readings from Last 3 Encounters:  03/09/21 5' 3"  (1.6 m)  01/03/21 5' 2"  (1.575 m)  08/04/20 5' 3.25" (1.607 m)    General appearance: alert, cooperative and appears stated age Head: Normocephalic, without obvious abnormality, atraumatic Neck: no adenopathy, supple, symmetrical, trachea midline and thyroid normal to inspection and palpation Lungs: clear to  auscultation bilaterally Cardiovascular: regular rate and rhythm Breasts: normal appearance, no masses or tenderness Abdomen: soft, non-tender; non distended,  no masses,  no organomegaly Extremities: extremities normal, atraumatic, no cyanosis or edema Skin: Skin color, texture, turgor normal. No rashes or lesions Lymph nodes: Cervical, supraclavicular, and axillary nodes normal. No abnormal inguinal nodes palpated Neurologic: Grossly normal   Pelvic: External genitalia:  no lesions              Urethra:  normal appearing urethra with no masses, tenderness or lesions              Bartholins and  Skenes: normal                 Vagina: normal appearing vagina with normal color and discharge, no lesions              Cervix: no lesions and friable with pap               Bimanual Exam:  Uterus:  normal size, contour, position, consistency, mobility, non-tender and anteverted              Adnexa: no mass, fullness, tenderness               Rectovaginal: deferred  Gae Dry chaperoned for the exam.  1. Well woman exam Discussed breast self exam Discussed calcium and vit D intake  2. Encounter for surveillance of contraceptive pills Doing well - drospirenone-ethinyl estradiol (YAZ) 3-0.02 MG tablet; Take 1 tablet by mouth daily. Skip placebo pills and take continuously.  Dispense: 112 tablet; Refill: 3  3. History of anemia - CBC - Ferritin  4. Elevated LDL cholesterol level - Lipid panel  5. Laboratory exam ordered as part of routine general medical examination - Comprehensive metabolic panel  6. Screening for cervical cancer - Cytology - PAP with hpv

## 2021-03-09 NOTE — Patient Instructions (Addendum)
EXERCISE   We recommended that you start or continue a regular exercise program for good health. Physical activity is anything that gets your body moving, some is better than none. The CDC recommends 150 minutes per week of Moderate-Intensity Aerobic Activity and 2 or more days of Muscle Strengthening Activity. ° °Benefits of exercise are limitless: helps weight loss/weight maintenance, improves mood and energy, helps with depression and anxiety, improves sleep, tones and strengthens muscles, improves balance, improves bone density, protects from chronic conditions such as heart disease, high blood pressure and diabetes and so much more. °To learn more visit: https://www.cdc.gov/physicalactivity/index.html ° °DIET: Good nutrition starts with a healthy diet of fruits, vegetables, whole grains, and lean protein sources. Drink plenty of water for hydration. Minimize empty calories, sodium, sweets. For more information about dietary recommendations visit: https://health.gov/our-work/nutrition-physical-activity/dietary-guidelines and https://www.myplate.gov/ ° °ALCOHOL:  Women should limit their alcohol intake to no more than 7 drinks/beers/glasses of wine (combined, not each!) per week. Moderation of alcohol intake to this level decreases your risk of breast cancer and liver damage.  If you are concerned that you may have a problem, or your friends have told you they are concerned about your drinking, there are many resources to help. A well-known program that is free, effective, and available to all people all over the nation is Alcoholics Anonymous.  Check out this site to learn more: https://www.aa.org/ ° ° °CALCIUM AND VITAMIN D:  Adequate intake of calcium and Vitamin D are recommended for bone health.  You should be getting between 1000-1200 mg of calcium and 800 units of Vitamin D daily between diet and supplements ° °PAP SMEARS:  Pap smears, to check for cervical cancer or precancers,  have traditionally been  done yearly, scientific advances have shown that most women can have pap smears less often.  However, every woman still should have a physical exam from her gynecologist every year. It will include a breast check, inspection of the vulva and vagina to check for abnormal growths or skin changes, a visual exam of the cervix, and then an exam to evaluate the size and shape of the uterus and ovaries. We will also provide age appropriate advice regarding health maintenance, like when you should have certain vaccines, screening for sexually transmitted diseases, bone density testing, colonoscopy, mammograms, etc.  ° °MAMMOGRAMS:  All women over 40 years old should have a routine mammogram.  ° °COLON CANCER SCREENING: Now recommend starting at age 45. At this time colonoscopy is not covered for routine screening until 50. There are take home tests that can be done between 45-49.  ° °COLONOSCOPY:  Colonoscopy to screen for colon cancer is recommended for all women at age 50.  We know, you hate the idea of the prep.  We agree, BUT, having colon cancer and not knowing it is worse!!  Colon cancer so often starts as a polyp that can be seen and removed at colonscopy, which can quite literally save your life!  And if your first colonoscopy is normal and you have no family history of colon cancer, most women don't have to have it again for 10 years.  Once every ten years, you can do something that may end up saving your life, right?  We will be happy to help you get it scheduled when you are ready.  Be sure to check your insurance coverage so you understand how much it will cost.  It may be covered as a preventative service at no cost, but you should check   your particular policy.   ° ° ° °Breast Self-Awareness °Breast self-awareness means being familiar with how your breasts look and feel. It involves checking your breasts regularly and reporting any changes to your health care provider. °Practicing breast self-awareness is  important. A change in your breasts can be a sign of a serious medical problem. Being familiar with how your breasts look and feel allows you to find any problems early, when treatment is more likely to be successful. All women should practice breast self-awareness, including women who have had breast implants. °How to do a breast self-exam °One way to learn what is normal for your breasts and whether your breasts are changing is to do a breast self-exam. To do a breast self-exam: °Look for Changes ° °Remove all the clothing above your waist. °Stand in front of a mirror in a room with good lighting. °Put your hands on your hips. °Push your hands firmly downward. °Compare your breasts in the mirror. Look for differences between them (asymmetry), such as: °Differences in shape. °Differences in size. °Puckers, dips, and bumps in one breast and not the other. °Look at each breast for changes in your skin, such as: °Redness. °Scaly areas. °Look for changes in your nipples, such as: °Discharge. °Bleeding. °Dimpling. °Redness. °A change in position. °Feel for Changes °Carefully feel your breasts for lumps and changes. It is best to do this while lying on your back on the floor and again while sitting or standing in the shower or tub with soapy water on your skin. Feel each breast in the following way: °Place the arm on the side of the breast you are examining above your head. °Feel your breast with the other hand. °Start in the nipple area and make ¾ inch (2 cm) overlapping circles to feel your breast. Use the pads of your three middle fingers to do this. Apply light pressure, then medium pressure, then firm pressure. The light pressure will allow you to feel the tissue closest to the skin. The medium pressure will allow you to feel the tissue that is a little deeper. The firm pressure will allow you to feel the tissue close to the ribs. °Continue the overlapping circles, moving downward over the breast until you feel your  ribs below your breast. °Move one finger-width toward the center of the body. Continue to use the ¾ inch (2 cm) overlapping circles to feel your breast as you move slowly up toward your collarbone. °Continue the up and down exam using all three pressures until you reach your armpit. ° °Write Down What You Find ° °Write down what is normal for each breast and any changes that you find. Keep a written record with breast changes or normal findings for each breast. By writing this information down, you do not need to depend only on memory for size, tenderness, or location. Write down where you are in your menstrual cycle, if you are still menstruating. °If you are having trouble noticing differences in your breasts, do not get discouraged. With time you will become more familiar with the variations in your breasts and more comfortable with the exam. °How often should I examine my breasts? °Examine your breasts every month. If you are breastfeeding, the best time to examine your breasts is after a feeding or after using a breast pump. If you menstruate, the best time to examine your breasts is 5-7 days after your period is over. During your period, your breasts are lumpier, and it may be more   difficult to notice changes. °When should I see my health care provider? °See your health care provider if you notice: °A change in shape or size of your breasts or nipples. °A change in the skin of your breast or nipples, such as a reddened or scaly area. °Unusual discharge from your nipples. °A lump or thick area that was not there before. °Pain in your breasts. °Anything that concerns you. °Preparing for Pregnancy °If you are planning to become pregnant, talk to your health care provider about preconception care. This type of care helps you prepare for a safe and healthy pregnancy. During this visit, your health care provider will: °Do a complete physical exam, including a Pap test. °Take your complete medical history. °Give you  information, answer your questions, and help you resolve problems. °Preconception checklist °Medical history °Tell your health care provider about any medical conditions you have or have had. Your pregnancy or your ability to become pregnant may be affected by long-term (chronic) conditions, such as: °Diabetes. °High blood pressure (hypertension). °Thyroid problems. °Tell your health care provider about your family's medical history and your partner's medical history. °Tell your health care provider if you have or have had any sexually transmitted infections, orSTIs. These can affect your pregnancy. In some cases, they can be passed to your baby. °If needed, discuss the benefits of genetic testing. This test checks for conditions that may be passed from parent to child. °Tell your health care provider about: °Any problems you had getting pregnant or while pregnant. °Any medicines you take. These include vitamins, herbal supplements, and over-the-counter medicines. °Your history of getting vaccines. Discuss any vaccines that you may need. °Diet °Ask your health care provider about what foods to eat in order to get a balance of nutrients. This is especially important when you are pregnant or preparing to become pregnant. It is recommended that women of childbearing age take a folic acid supplement of 400 mcg daily and eat foods rich in folic acid to prevent certain birth defects. °Ask your health care provider to help you reach a healthy weight before pregnancy. °If you are overweight, you may have a higher risk for certain problems. These include hypertension, diabetes, and early (preterm) birth. °If you are underweight, you are more likely to have a baby who has a low birth weight. °Lifestyle, work, and home °Let your health care provider know about: °Any lifestyle habits that you have, such as use of alcohol, drugs, or tobacco products. °Fun and leisure activities that may put you at risk during pregnancy, such as  downhill skiing and certain exercise programs. °Any plans to travel out of the country, especially to places with an active Zika virus outbreak. °Harmful substances that you may be exposed to at work or at home. These include chemicals, pesticides, radiation, and substances from cat litter boxes. °Any concerns you have for your safety at home. °Mental health °Tell your health care provider about: °Any history of mental health conditions, including feelings of depression, sadness, or anxiety. °Any medicines that you take for a mental health condition. These include herbs and supplements. °How do I know that I am pregnant? °You may be pregnant if you have been sexually active and you miss your period. Other symptoms of early pregnancy include: °Mild cramping. °Very light vaginal bleeding (spotting). °Feeling more tired than usual. °Nausea and vomiting. These may be signs of morning sickness. °Take a home pregnancy test if you have any of these symptoms. This test checks for a   hormone in your urine called human chorionic gonadotropin, or hCG. A woman's body begins to make this hormone during early pregnancy. These tests are very accurate. °Wait until at least the first day after you miss your period to take a home pregnancy test. If the test shows that you are pregnant, call your health care provider for a prenatal care visit. °What should I do if I become pregnant? °Schedule a visit with your health care provider as soon as you suspect you are pregnant. °Talk to your health care provider if you are taking prescription medicines to determine if they are safe to take during pregnancy. °You may continue to have sex if it does not cause pain or other problems, such as vaginal bleeding. °Follow these instructions at home: °Eating and drinking ° °Follow instructions from your health care provider about eating or drinking restrictions. °Drink enough fluid to keep your urine pale yellow. °Eat a balanced diet. This includes  fresh fruits and vegetables, whole grains, lean meats, low-fat dairy products, healthy fats, and foods that are high in fiber. Ask to meet with a nutritionist or registered dietitian for help with meal planning and goals. °Avoid eating raw or undercooked meat and seafood. °Avoid eating or drinking unpasteurized dairy products. °Lifestyle °  °Get regular exercise. Try to be active for at least 30 minutes a day on most days of the week. Ask your health care provider which activities are safe during pregnancy. °Maintain a healthy weight. °Avoid toxic fumes and chemicals. °Avoid cleaning cat litter boxes. Cat feces may contain a harmful parasite called toxoplasma. °Avoid travel to countries where Zika virus is common. °Do not use any products that contain nicotine or tobacco, such as cigarettes, e-cigarettes, and chewing tobacco. If you need help quitting, ask your health care provider. °Do not drink alcohol or use drugs. °General instructions °Keep an accurate record of your menstrual periods. This makes it easier for your health care provider to determine your baby's due date. °Take over-the-counter and prescription medicines only as told by your health care provider. °Begin taking prenatal vitamins and folic acid supplements daily as directed. °Manage any chronic conditions, such as hypertension and diabetes, as told by your health care provider. This is important. °Summary °If you are planning to become pregnant, talk to your health care provider about preconception care. This is an important part of planning for a healthy pregnancy. °Women of childbearing age should take 400 mcg of folic acid daily in addition to eating a diet rich in folic acid. This will prevent certain birth defects. °Schedule a visit with your health care provider as soon as you suspect you are pregnant. Tell your health care provider about your medical history, lifestyle activities, home safety, and other things that may concern you. °This  information is not intended to replace advice given to you by your health care provider. Make sure you discuss any questions you have with your health care provider. °Document Revised: 05/28/2019 Document Reviewed: 05/28/2019 °Elsevier Patient Education © 2022 [employer] ° °

## 2021-03-10 LAB — CBC
HCT: 35.9 % (ref 35.0–45.0)
Hemoglobin: 12.5 g/dL (ref 11.7–15.5)
MCH: 31.6 pg (ref 27.0–33.0)
MCHC: 34.8 g/dL (ref 32.0–36.0)
MCV: 90.9 fL (ref 80.0–100.0)
MPV: 11.4 fL (ref 7.5–12.5)
Platelets: 197 10*3/uL (ref 140–400)
RBC: 3.95 10*6/uL (ref 3.80–5.10)
RDW: 11.5 % (ref 11.0–15.0)
WBC: 5.3 10*3/uL (ref 3.8–10.8)

## 2021-03-10 LAB — LIPID PANEL
Cholesterol: 167 mg/dL (ref <200)
HDL: 61 mg/dL (ref >=50)
LDL Cholesterol (Calc): 82 mg/dL (calc)
Non-HDL Cholesterol (Calc): 106 mg/dL (calc) (ref <130)
Total CHOL/HDL Ratio: 2.7 (calc) (ref <5.0)
Triglycerides: 144 mg/dL (ref <150)

## 2021-03-10 LAB — COMPREHENSIVE METABOLIC PANEL
AG Ratio: 1.6 (calc) (ref 1.0–2.5)
ALT: 15 U/L (ref 6–29)
AST: 18 U/L (ref 10–30)
Albumin: 4.1 g/dL (ref 3.6–5.1)
Alkaline phosphatase (APISO): 37 U/L (ref 31–125)
BUN: 11 mg/dL (ref 7–25)
CO2: 24 mmol/L (ref 20–32)
Calcium: 9 mg/dL (ref 8.6–10.2)
Chloride: 104 mmol/L (ref 98–110)
Creat: 0.86 mg/dL (ref 0.50–1.10)
Globulin: 2.6 g/dL (calc) (ref 1.9–3.7)
Glucose, Bld: 92 mg/dL (ref 65–99)
Potassium: 4 mmol/L (ref 3.5–5.3)
Sodium: 136 mmol/L (ref 135–146)
Total Bilirubin: 0.3 mg/dL (ref 0.2–1.2)
Total Protein: 6.7 g/dL (ref 6.1–8.1)

## 2021-03-10 LAB — CYTOLOGY - PAP
Comment: NEGATIVE
Diagnosis: NEGATIVE
High risk HPV: NEGATIVE

## 2021-03-10 LAB — FERRITIN: Ferritin: 38 ng/mL (ref 16–154)

## 2021-08-29 ENCOUNTER — Telehealth: Payer: Self-pay | Admitting: *Deleted

## 2021-08-29 NOTE — Telephone Encounter (Signed)
She is having breakthrough bleeding on the pill. If it doesn't stop or slow down in the next few days, she should take a 4 day break, then restart the pills. As you told her, if she is bleeding through a super tampon and hour she needs to be seen.

## 2021-08-29 NOTE — Telephone Encounter (Signed)
Left detailed message on patient voicemail per DPR access.  

## 2021-08-29 NOTE — Telephone Encounter (Signed)
Patient called takes Yaz pills continuously,reports last week she noticed very light spotting, last night she started to have intense cramping, used a heating pad. This morning she woke up at 8 am and when she stood up felt blood. When using the bathroom she noticed heavy bleeding, wear super tampon and the flow is not heavy as it was this am. Took UPT 2 days ago it was negative, sexually active with husband no back up contraception. Reports no missed pills recently. About 4 weeks ago she missed 1 pill, doubled up the next day. Heavy bleeding precautions given to patient saturating tampon/pad every 1 hour should be seen, if after hours report to ER. I advised patient I relay this information by you as well. Please advise

## 2021-08-29 NOTE — Telephone Encounter (Signed)
Left message for patient to call.

## 2021-09-11 NOTE — L&D Delivery Note (Signed)
Delivery Note She progressed to complete and pushed for less than 30 minutes.  At 6:30 PM a viable female was delivered via Vaginal, Spontaneous (Presentation: Left Occiput Anterior).  APGAR: 9, 9; weight pending.   Placenta status: Spontaneous, Intact.  Cord: 3 vessels with the following complications: None.  Anesthesia: Epidural Episiotomy: None Lacerations: 2nd degree;Perineal Suture Repair: 3.0 vicryl rapide Est. Blood Loss (mL): 143  Mom to postpartum.  Baby to Couplet care / Skin to Skin.  They would like baby circumcised, questions answered.  Blane Ohara Sherwin Hollingshed 07/05/2022, 6:50 PM

## 2021-11-08 ENCOUNTER — Other Ambulatory Visit: Payer: Self-pay

## 2021-11-08 ENCOUNTER — Ambulatory Visit: Payer: Managed Care, Other (non HMO) | Admitting: Obstetrics and Gynecology

## 2021-11-08 ENCOUNTER — Encounter: Payer: Self-pay | Admitting: Obstetrics and Gynecology

## 2021-11-08 VITALS — BP 118/62 | HR 77 | Ht 62.0 in | Wt 132.0 lb

## 2021-11-08 DIAGNOSIS — Z3201 Encounter for pregnancy test, result positive: Secondary | ICD-10-CM | POA: Diagnosis not present

## 2021-11-08 DIAGNOSIS — N926 Irregular menstruation, unspecified: Secondary | ICD-10-CM

## 2021-11-08 LAB — HCG, QUANTITATIVE, PREGNANCY: HCG, Total, QN: 3921 m[IU]/mL

## 2021-11-08 LAB — PREGNANCY, URINE: Preg Test, Ur: POSITIVE — AB

## 2021-11-08 NOTE — Patient Instructions (Signed)
Commonly Asked Questions During Pregnancy  Cats: A parasite can be excreted in cat feces.  To avoid exposure you need to have another person empty the little box.  If you must empty the litter box you will need to wear gloves.  Wash your hands after handling your cat.  This parasite can also be found in raw or undercooked meat so this should also be avoided.  Colds, Sore Throats, Flu: Please check your medication sheet to see what you can take for symptoms.  If your symptoms are unrelieved by these medications please call the office.  Dental Work: Most any dental work Investment banker, corporate recommends is permitted.  X-rays should only be taken during the first trimester if absolutely necessary.  Your abdomen should be shielded with a lead apron during all x-rays.  Please notify your provider prior to receiving any x-rays.  Novocaine is fine; gas is not recommended.  If your dentist requires a note from [place] prior to dental work please call the office and we will provide one for you.  Exercise: Exercise is an important part of staying healthy during your pregnancy.  You may continue most exercises you were accustomed to prior to pregnancy.  Later in your pregnancy you will most likely notice you have difficulty with activities requiring balance like riding a bicycle.  It is important that you listen to your body and avoid activities that put you at a higher risk of falling.  Adequate rest and staying well hydrated are a must!  If you have questions about the safety of specific activities ask your provider.    Exposure to Children with illness: Try to avoid obvious exposure; report any symptoms to [place] when noted,  If you have chicken pos, red measles or mumps, you should be immune to these diseases.   Please do not take any vaccines while pregnant unless you have checked with your OB provider.  Fetal Movement: After 28 weeks we recommend you do "kick counts" twice daily.  Lie or sit down in a calm quiet environment and  count your baby movements "kicks".  You should feel your baby at least 10 times per hour.  If you have not felt 10 kicks within the first hour get up, walk around and have something sweet to eat or drink then repeat for an additional hour.  If count remains less than 10 per hour notify your provider.  Fumigating: Follow your pest control agent's advice as to how long to stay out of your home.  Ventilate the area well before re-entering.  Hemorrhoids:   Most over-the-counter preparations can be used during pregnancy.  Check your medication to see what is safe to use.  It is important to use a stool softener or fiber in your diet and to drink lots of liquids.  If hemorrhoids seem to be getting worse please call the office.   Hot Tubs:  Hot tubs Jacuzzis and saunas are not recommended while pregnant.  These increase your internal body temperature and should be avoided.  Intercourse:  Sexual intercourse is safe during pregnancy as long as you are comfortable, unless otherwise advised by your provider.  Spotting may occur after intercourse; report any bright red bleeding that is heavier than spotting.  Labor:  If you know that you are in labor, please go to the hospital.  If you are unsure, please call the office and let us help you decide what to do.  Lifting, straining, etc:  If your job requires heavy  lifting or straining please check with your provider for any limitations.  Generally, you should not lift items heavier than that you can lift simply with your hands and arms (no back muscles)  Painting:  Paint fumes do not harm your pregnancy, but may make you ill and should be avoided if possible.  Latex or water based paints have less odor than oils.  Use adequate ventilation while painting.  Permanents & Hair Color:  Chemicals in hair dyes are not recommended as they cause increase hair dryness which can increase hair loss during pregnancy.  " Highlighting" and permanents are allowed.  Dye may be  absorbed differently and permanents may not hold as well during pregnancy.  Sunbathing:  Use a sunscreen, as skin burns easily during pregnancy.  Drink plenty of fluids; avoid over heating.  Tanning Beds:  Because their possible side effects are still unknown, tanning beds are not recommended.  Ultrasound Scans:  Routine ultrasounds are performed at approximately 20 weeks.  You will be able to see your baby's general anatomy an if you would like to know the gender this can usually be determined as well.  If it is questionable when you conceived you may also receive an ultrasound early in your pregnancy for dating purposes.  Otherwise ultrasound exams are not routinely performed unless there is a medical necessity.  Although you can request a scan we ask that you pay for it when conducted because insurance does not cover " patient request" scans.  Work: If your pregnancy proceeds without complications you may work until your due date, unless your physician or employer advises otherwise.  Round Ligament Pain/Pelvic Discomfort:  Sharp, shooting pains not associated with bleeding are fairly common, usually occurring in the second trimester of pregnancy.  They tend to be worse when standing up or when you remain standing for long periods of time.  These are the result of pressure of certain pelvic ligaments called "round ligaments".  Rest, Tylenol and heat seem to be the most effective relief.  As the womb and fetus grow, they rise out of the pelvis and the discomfort improves.  Please notify the office if your pain seems different than that described.  It may represent a more serious condition.  Common Medications Safe in Pregnancy  Acne:      Constipation:  Benzoyl Peroxide     Colace  Clindamycin      Dulcolax Suppository  Topica Erythromycin     Fibercon  Salicylic Acid      Metamucil         Miralax AVOID:        Senakot   Accutane    Cough:  Retin-A       Cough  Drops  Tetracycline      Phenergan w/ Codeine if Rx  Minocycline      Robitussin (Plain & DM)  Antibiotics:     Crabs/Lice:  Ceclor       RID  Cephalosporins    AVOID:  E-Mycins      Kwell  Keflex  Macrobid/Macrodantin   Diarrhea:  Penicillin      Kao-Pectate  Zithromax      Imodium AD         PUSH FLUIDS AVOID:       Cipro     Fever:  Tetracycline      Tylenol (Regular or Extra  Minocycline       Strength)  Levaquin      Extra Strength-Do not  Exceed 8 tabs/24 hrs Caffeine:        <200mg/day (equiv. To 1 cup of coffee or  approx. 3 12 oz sodas)         Gas: Cold/Hayfever:       Gas-X  Benadryl      Mylicon  Claritin       Phazyme  **Claritin-D        Chlor-Trimeton    Headaches:  Dimetapp      ASA-Free Excedrin  Drixoral-Non-Drowsy     Cold Compress  Mucinex (Guaifenasin)     Tylenol (Regular or Extra  Sudafed/Sudafed-12 Hour     Strength)  **Sudafed PE Pseudoephedrine   Tylenol Cold & Sinus     Vicks Vapor Rub  Zyrtec  **AVOID if Problems With Blood Pressure         Heartburn: Avoid lying down for at least 1 hour after meals  Aciphex      Maalox     Rash:  Milk of Magnesia     Benadryl    Mylanta       1% Hydrocortisone Cream  Pepcid  Pepcid Complete   Sleep Aids:  Prevacid      Ambien   Prilosec       Benadryl  Rolaids       Chamomile Tea  Tums (Limit 4/day)     Unisom         Tylenol PM         Warm milk-add vanilla or  Hemorrhoids:       Sugar for taste  Anusol/Anusol H.C.  (RX: Analapram 2.5%)  Sugar Substitutes:  Hydrocortisone OTC     Ok in moderation  Preparation H      Tucks        Vaseline lotion applied to tissue with wiping    Herpes:     Throat:  Acyclovir      Oragel  Famvir  Valtrex     Vaccines:         Flu Shot Leg Cramps:       *Gardasil  Benadryl      Hepatitis A         Hepatitis B Nasal Spray:       Pneumovax  Saline Nasal Spray     Polio Booster         Tetanus Nausea:       Tuberculosis test or PPD  Vitamin  B6 25 mg TID   AVOID:    Dramamine      *Gardasil  Emetrol       Live Poliovirus  Ginger Root 250 mg QID    MMR (measles, mumps &  High Complex Carbs @ Bedtime    rebella)  Sea Bands-Accupressure    Varicella (Chickenpox)  Unisom 1/2 tab TID     *No known complications           If received before Pain:         Known pregnancy;   Darvocet       Resume series after  Lortab        Delivery  Percocet    Yeast:   Tramadol      Femstat  Tylenol 3      Gyne-lotrimin  Ultram       Monistat  Vicodin           MISC:         All Sunscreens             Hair Coloring/highlights          Insect Repellant's          (Including DEET)         Mystic Tans

## 2021-11-08 NOTE — Progress Notes (Signed)
GYNECOLOGY  VISIT   HPI: 26 y.o.   Married White or Caucasian Not Hispanic or Latino  female   G0P0000 with Patient's last menstrual period was 10/01/2021.   here for  pregnancy conformation. She went off of OCP's and got pregnant right away.  She had a +UPT at home on 10/30/21. She spotted a few days prior to her +UPT.   No bleeding. Feels okay, slight nausea and c/o fatigue.   No h/o GC/CT, no h/o infertility.   She has questions about diet, exercise and medications.    GYNECOLOGIC HISTORY: Patient's last menstrual period was 10/01/2021. Contraception:none  Menopausal hormone therapy: none         OB History     Gravida  0   Para  0   Term  0   Preterm  0   AB  0   Living  0      SAB  0   IAB  0   Ectopic  0   Multiple  0   Live Births                 There are no problems to display for this patient.   Past Medical History:  Diagnosis Date   Abnormal Pap smear of cervix    ADD (attention deficit disorder)    no meds currently   Anxiety    History of mononucleosis 08/2012   Kidney stones     Past Surgical History:  Procedure Laterality Date   COLPOSCOPY N/A 03/05/2018   Procedure: COLPOSCOPY;  Surgeon: Salvadore Dom, MD;  Location: [facility];  Service: Gynecology;  Laterality: N/A;   LEEP N/A 03/05/2018   Procedure: LOOP ELECTROSURGICAL EXCISION PROCEDURE (LEEP) with ECC;  Surgeon: Salvadore Dom, MD;  Location: [facility];  Service: Gynecology;  Laterality: N/A;  with ECC   WISDOM TOOTH EXTRACTION      No current outpatient medications on file.   No current facility-administered medications for this visit.     ALLERGIES: Patient has no known allergies.  Family History  Problem Relation Age of Onset   Multiple myeloma Maternal Grandmother    Heart disease Maternal Grandfather        heart stents   Graves' disease Mother     Social History   Socioeconomic History   Marital status: Married    Spouse name: Not on file    Number of children: Not on file   Years of education: Not on file   Highest education level: Not on file  Occupational History   Not on file  Tobacco Use   Smoking status: Never   Smokeless tobacco: Never  Vaping Use   Vaping Use: Never used  Substance and Sexual Activity   Alcohol use: Yes    Alcohol/week: 3.0 - 5.0 standard drinks    Types: 3 - 5 Glasses of wine per week   Drug use: No   Sexual activity: Yes    Partners: Male    Birth control/protection: Pill  Other Topics Concern   Not on file  Social History Narrative   Not on file   Social Determinants of Health   Financial Resource Strain: Not on file  Food Insecurity: Not on file  Transportation Needs: Not on file  Physical Activity: Not on file  Stress: Not on file  Social Connections: Not on file  Intimate Partner Violence: Not on file    Review of Systems  All other systems reviewed and are negative.  PHYSICAL EXAMINATION:    BP 118/62    Pulse 77    Ht 5' 2"  (1.575 m)    Wt 132 lb (59.9 kg)    LMP 10/01/2021    SpO2 99%    BMI 24.14 kg/m     General appearance: alert, cooperative and appears stated age  67. Missed menses Got pregnant after stopping OCP's - Pregnancy, urine:+ - B-HCG Quant, uncertain dating  2. Positive pregnancy test -She is on PNV -Discussed exercise in pregnancy, medication and diet in pregnancy. Information given on mychart and from Up To Date.   Over 30 minutes in total patient care.

## 2021-11-09 ENCOUNTER — Other Ambulatory Visit: Payer: Self-pay

## 2021-11-09 DIAGNOSIS — Z369 Encounter for antenatal screening, unspecified: Secondary | ICD-10-CM

## 2021-12-06 LAB — OB RESULTS CONSOLE GC/CHLAMYDIA
Chlamydia: NEGATIVE
Neisseria Gonorrhea: NEGATIVE

## 2021-12-06 LAB — HEPATITIS C ANTIBODY: HCV Ab: NEGATIVE

## 2021-12-06 LAB — OB RESULTS CONSOLE ANTIBODY SCREEN: Antibody Screen: NEGATIVE

## 2021-12-06 LAB — OB RESULTS CONSOLE RPR: RPR: NONREACTIVE

## 2021-12-06 LAB — OB RESULTS CONSOLE ABO/RH: RH Type: POSITIVE

## 2021-12-06 LAB — OB RESULTS CONSOLE HIV ANTIBODY (ROUTINE TESTING): HIV: NONREACTIVE

## 2021-12-06 LAB — OB RESULTS CONSOLE RUBELLA ANTIBODY, IGM: Rubella: IMMUNE

## 2021-12-06 LAB — OB RESULTS CONSOLE HEPATITIS B SURFACE ANTIGEN: Hepatitis B Surface Ag: NEGATIVE

## 2021-12-08 ENCOUNTER — Other Ambulatory Visit: Payer: Managed Care, Other (non HMO)

## 2021-12-08 ENCOUNTER — Other Ambulatory Visit: Payer: Managed Care, Other (non HMO) | Admitting: Obstetrics and Gynecology

## 2022-04-07 IMAGING — CT CT ABD-PELV W/ CM
2 of 4 series · 15 of 46 positions shown, 17 images · IV contrast (omnipaque)
Comparison: None.

CLINICAL DATA: And nausea today. Onset right lower quadrant
tenderness the patient had abdominal and back pain last week.

EXAM:
CT ABDOMEN AND PELVIS WITH CONTRAST
TECHNIQUE: Multidetector CT imaging of the abdomen and pelvis was performed
using the standard protocol following bolus administration of
intravenous contrast.
CONTRAST:  100 mL OMNIPAQUE IOHEXOL 300 MG/ML  SOLN

[Series 3: a/p w/ 5mm · axial · 0.86mm/px · z∈[-400,-35]mm · 12 of 87 slices shown, 14 images]
[im 7/87  soft-tissue]
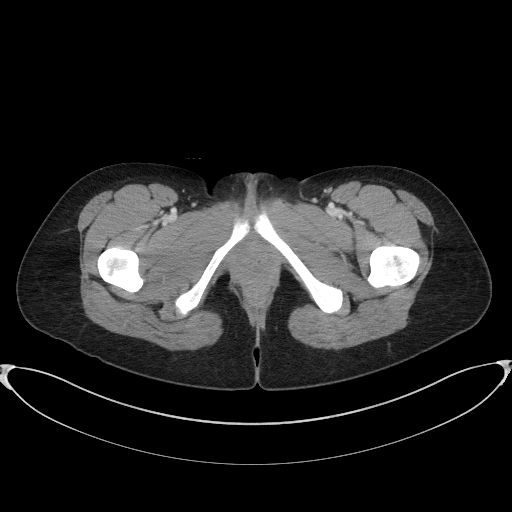
[im 7/87  bone]
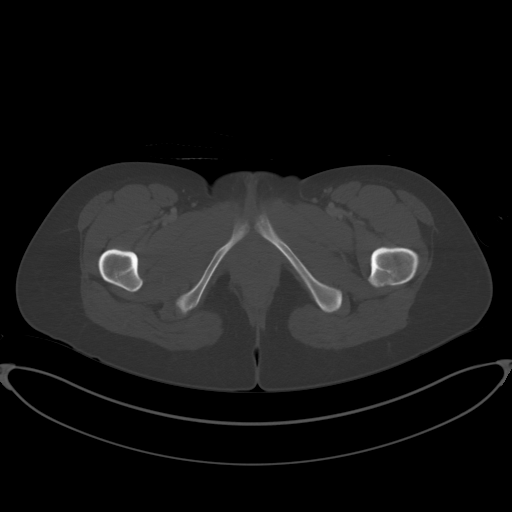
[im 14/87  soft-tissue]
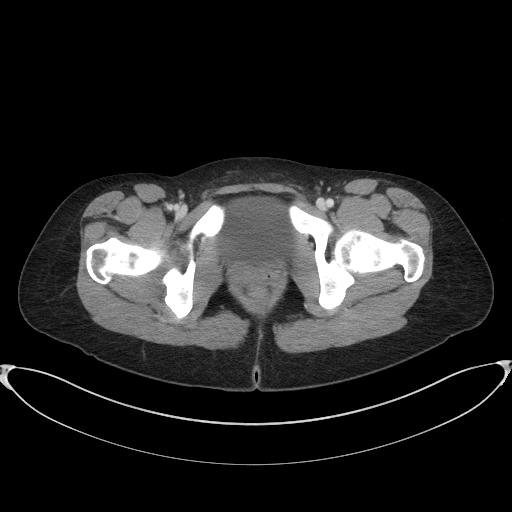
[im 20/87  soft-tissue]
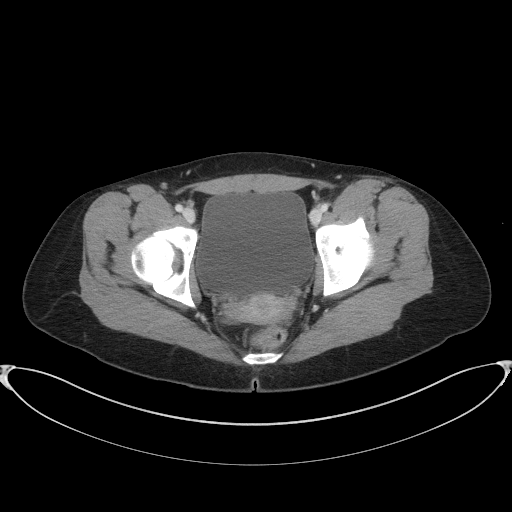
[im 27/87  soft-tissue]
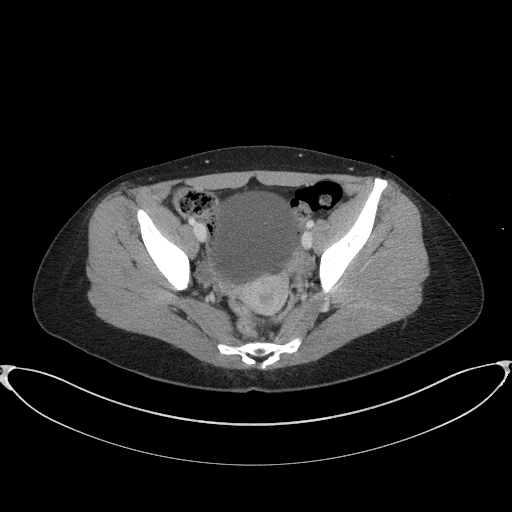
[im 34/87  soft-tissue]
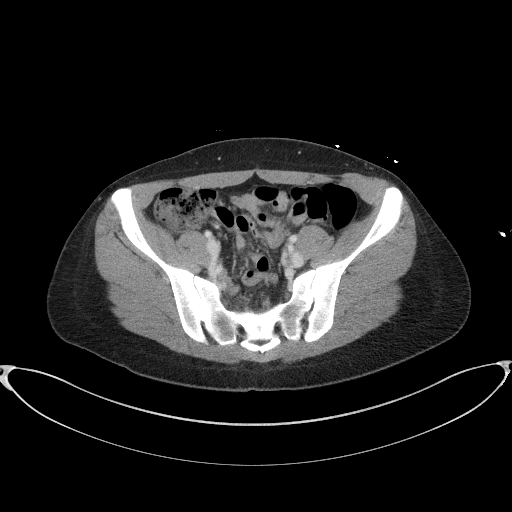
[im 40/87  soft-tissue]
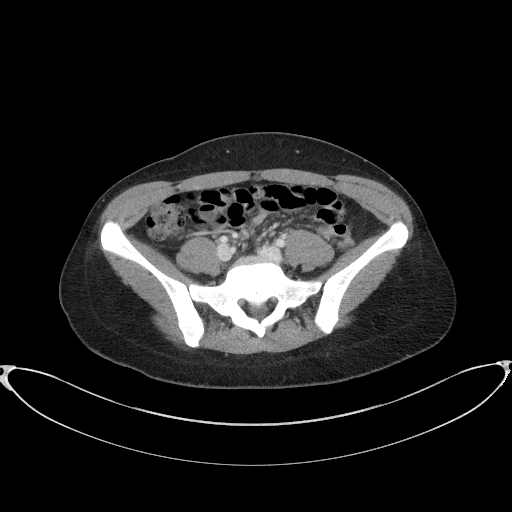
[im 47/87  soft-tissue]
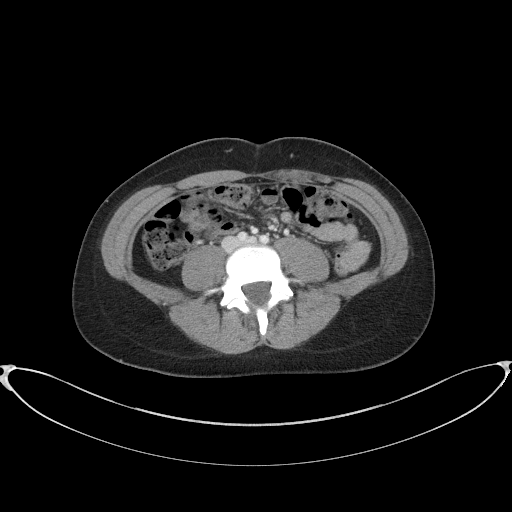
[im 53/87  soft-tissue]
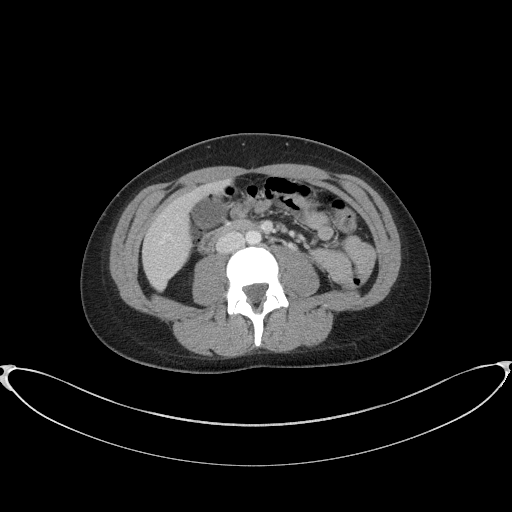
[im 60/87  soft-tissue]
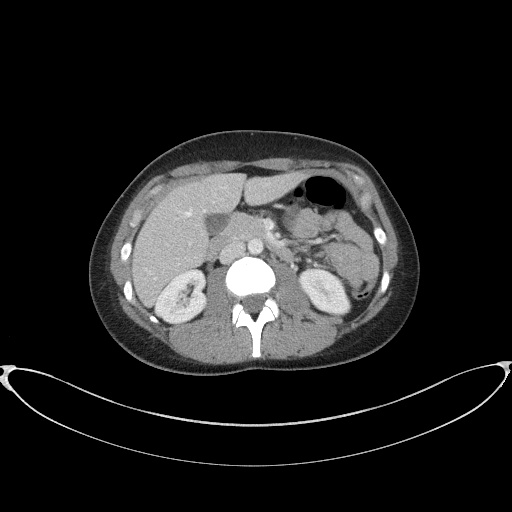
[im 60/87  bone]
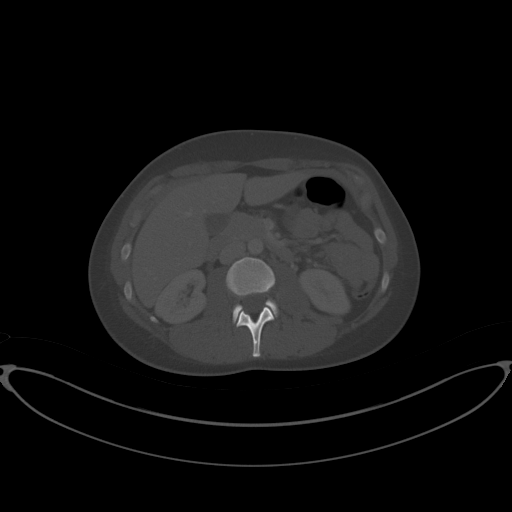
[im 67/87  soft-tissue]
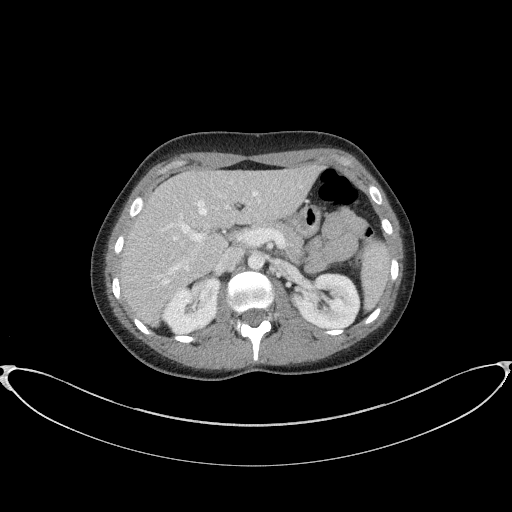
[im 73/87  soft-tissue]
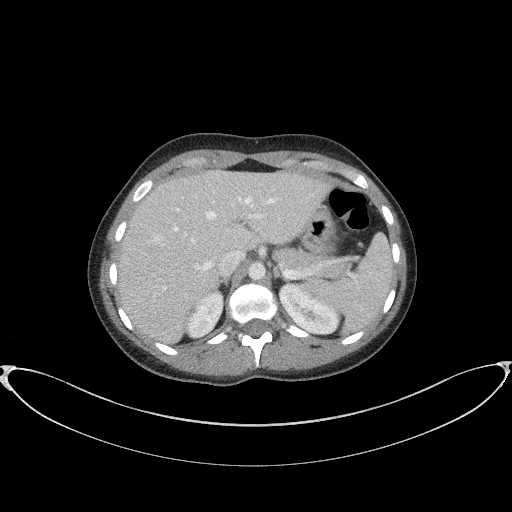
[im 80/87  soft-tissue]
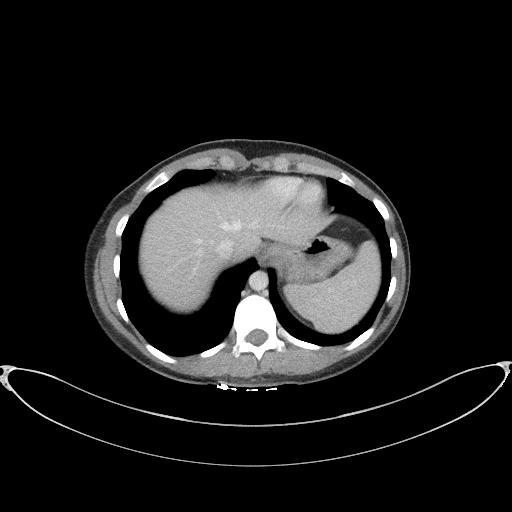

[Series 6: a/p w/ cor · coronal · 0.60mm/px · 3 of 99 slices shown]
[im 33/99  soft-tissue]
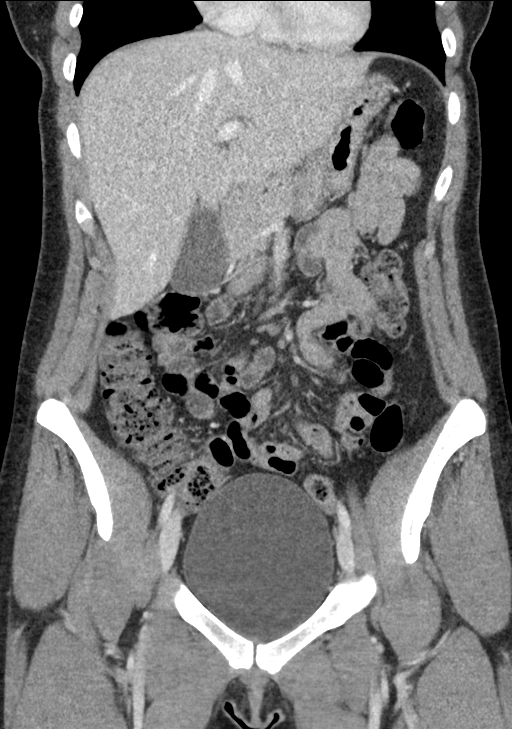
[im 44/99  soft-tissue]
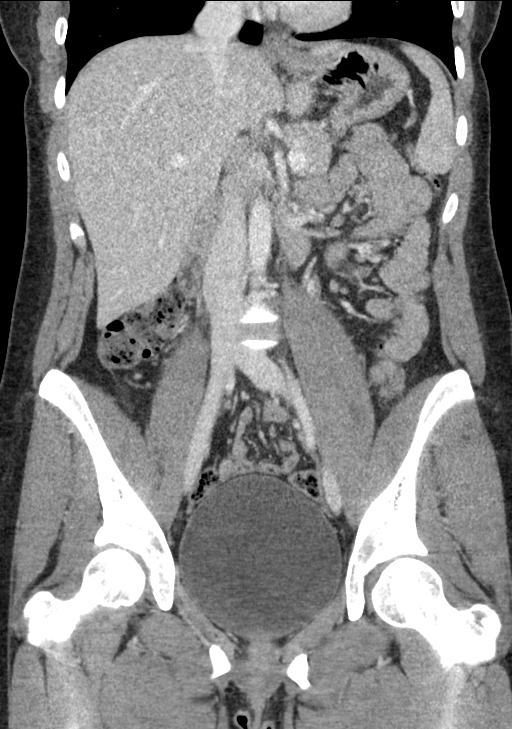
[im 55/99  soft-tissue]
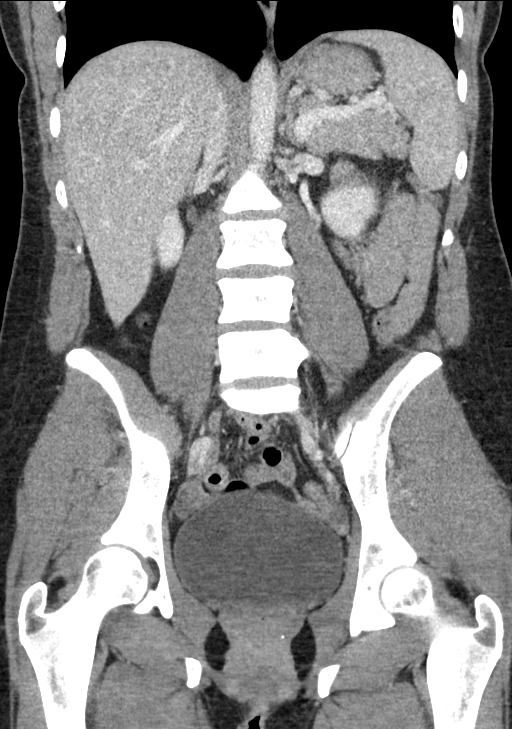

[15 of 46 positions shown; findings below may reference images not displayed]

FINDINGS: Lower chest: Lung bases clear.  No pleural or pericardial effusion.

Hepatobiliary: No focal liver abnormality is seen. No gallstones,
gallbladder wall thickening, or biliary dilatation.

Pancreas: Unremarkable. No pancreatic ductal dilatation or
surrounding inflammatory changes.

Spleen: Normal in size without focal abnormality.

Adrenals/Urinary Tract: The adrenal glands appear normal. The
patient has 3 small foci of increased attenuation in the right renal
collecting system which could be stones or early contrast excretion.
The largest is 0.3 cm in diameter. There is no hydronephrosis on the
right or left. No ureteral or urinary bladder stones.

Stomach/Bowel: Stomach is within normal limits. Appendix appears
normal. No evidence of bowel wall thickening, distention, or
inflammatory changes.

Vascular/Lymphatic: No significant vascular findings are present. No
enlarged abdominal or pelvic lymph nodes.

Reproductive: Uterus and bilateral adnexa are unremarkable.

Other: Small fat containing umbilical hernia noted.

Musculoskeletal: Negative.
IMPRESSION: No acute abnormality or finding to explain the patient's symptoms.

Three small foci of increased attenuation in the right kidney may be
small nonobstructing stones or could be due to early contrast
excretion. Negative for ureteral stone or hydronephrosis.

## 2022-06-14 LAB — OB RESULTS CONSOLE GBS: GBS: NEGATIVE

## 2022-06-22 ENCOUNTER — Telehealth (HOSPITAL_COMMUNITY): Payer: Self-pay | Admitting: *Deleted

## 2022-06-22 ENCOUNTER — Encounter (HOSPITAL_COMMUNITY): Payer: Self-pay | Admitting: *Deleted

## 2022-06-22 NOTE — Telephone Encounter (Signed)
Preadmission screen  

## 2022-06-28 ENCOUNTER — Encounter (HOSPITAL_COMMUNITY): Payer: Self-pay | Admitting: *Deleted

## 2022-07-04 ENCOUNTER — Other Ambulatory Visit: Payer: Self-pay | Admitting: Obstetrics and Gynecology

## 2022-07-05 ENCOUNTER — Inpatient Hospital Stay (HOSPITAL_COMMUNITY): Payer: Managed Care, Other (non HMO)

## 2022-07-05 ENCOUNTER — Inpatient Hospital Stay (HOSPITAL_COMMUNITY)
Admission: RE | Admit: 2022-07-05 | Discharge: 2022-07-07 | DRG: 807 | Disposition: A | Discharge status: Home / Self Care | Payer: Managed Care, Other (non HMO) | Attending: Obstetrics and Gynecology | Admitting: Obstetrics and Gynecology

## 2022-07-05 ENCOUNTER — Encounter (HOSPITAL_COMMUNITY): Payer: Self-pay | Admitting: Obstetrics and Gynecology

## 2022-07-05 ENCOUNTER — Inpatient Hospital Stay (HOSPITAL_COMMUNITY): Payer: Managed Care, Other (non HMO) | Admitting: Anesthesiology

## 2022-07-05 ENCOUNTER — Other Ambulatory Visit: Payer: Self-pay

## 2022-07-05 DIAGNOSIS — O9912 Other diseases of the blood and blood-forming organs and certain disorders involving the immune mechanism complicating childbirth: Principal | ICD-10-CM | POA: Diagnosis present

## 2022-07-05 DIAGNOSIS — D6959 Other secondary thrombocytopenia: Secondary | ICD-10-CM | POA: Diagnosis present

## 2022-07-05 DIAGNOSIS — O43123 Velamentous insertion of umbilical cord, third trimester: Secondary | ICD-10-CM | POA: Diagnosis present

## 2022-07-05 DIAGNOSIS — Z3A39 39 weeks gestation of pregnancy: Secondary | ICD-10-CM

## 2022-07-05 DIAGNOSIS — O26893 Other specified pregnancy related conditions, third trimester: Secondary | ICD-10-CM | POA: Diagnosis present

## 2022-07-05 LAB — CBC
HCT: 33.3 % — ABNORMAL LOW (ref 36.0–46.0)
HCT: 31.8 % — ABNORMAL LOW (ref 36.0–46.0)
Hemoglobin: 11.8 g/dL — ABNORMAL LOW (ref 12.0–15.0)
Hemoglobin: 11 g/dL — ABNORMAL LOW (ref 12.0–15.0)
MCH: 32.7 pg (ref 26.0–34.0)
MCH: 31.9 pg (ref 26.0–34.0)
MCHC: 35.4 g/dL (ref 30.0–36.0)
MCHC: 34.6 g/dL (ref 30.0–36.0)
MCV: 92.2 fL (ref 80.0–100.0)
MCV: 92.2 fL (ref 80.0–100.0)
Platelets: 145 10*3/uL — ABNORMAL LOW (ref 150–400)
Platelets: 121 10*3/uL — ABNORMAL LOW (ref 150–400)
RBC: 3.61 MIL/uL — ABNORMAL LOW (ref 3.87–5.11)
RBC: 3.45 MIL/uL — ABNORMAL LOW (ref 3.87–5.11)
RDW: 12.2 % (ref 11.5–15.5)
RDW: 12.2 % (ref 11.5–15.5)
WBC: 9.3 10*3/uL (ref 4.0–10.5)
WBC: 13.1 10*3/uL — ABNORMAL HIGH (ref 4.0–10.5)
nRBC: 0 % (ref 0.0–0.2)
nRBC: 0 % (ref 0.0–0.2)

## 2022-07-05 LAB — TYPE AND SCREEN
ABO/RH(D): O POS
Antibody Screen: NEGATIVE

## 2022-07-05 LAB — RPR: RPR Ser Ql: NONREACTIVE

## 2022-07-05 MED ORDER — IBUPROFEN 600 MG PO TABS
600.0000 mg | ORAL_TABLET | Freq: Four times a day (QID) | ORAL | Status: DC
  Administered 2022-07-05 – 2022-07-07 (×6): 600 mg via ORAL
  Filled 2022-07-05 (×7): qty 1

## 2022-07-05 MED ORDER — SOD CITRATE-CITRIC ACID 500-334 MG/5ML PO SOLN: 30.0000 mL | ORAL | Status: DC | PRN

## 2022-07-05 MED ORDER — TERBUTALINE SULFATE 1 MG/ML IJ SOLN: 0.2500 mg | Freq: Once | INTRAMUSCULAR | Status: DC | PRN

## 2022-07-05 MED ORDER — SENNOSIDES-DOCUSATE SODIUM 8.6-50 MG PO TABS
2.0000 | ORAL_TABLET | Freq: Every day | ORAL | Status: DC
  Administered 2022-07-06: 2 via ORAL
  Filled 2022-07-05 (×2): qty 2

## 2022-07-05 MED ORDER — PHENYLEPHRINE 80 MCG/ML (10ML) SYRINGE FOR IV PUSH (FOR BLOOD PRESSURE SUPPORT): 80.0000 ug | PREFILLED_SYRINGE | INTRAVENOUS | Status: DC | PRN

## 2022-07-05 MED ORDER — LACTATED RINGERS IV SOLN
500.0000 mL | Freq: Once | INTRAVENOUS | Status: AC
  Administered 2022-07-05: 500 mL via INTRAVENOUS

## 2022-07-05 MED ORDER — MEASLES, MUMPS & RUBELLA VAC IJ SOLR: 0.5000 mL | Freq: Once | INTRAMUSCULAR | Status: DC

## 2022-07-05 MED ORDER — DIPHENHYDRAMINE HCL 50 MG/ML IJ SOLN: 12.5000 mg | INTRAMUSCULAR | Status: DC | PRN

## 2022-07-05 MED ORDER — WITCH HAZEL-GLYCERIN EX PADS: 1.0000 | MEDICATED_PAD | CUTANEOUS | Status: DC | PRN

## 2022-07-05 MED ORDER — METHYLERGONOVINE MALEATE 0.2 MG PO TABS: 0.2000 mg | ORAL_TABLET | ORAL | Status: DC | PRN

## 2022-07-05 MED ORDER — MAGNESIUM HYDROXIDE 400 MG/5ML PO SUSP: 30.0000 mL | ORAL | Status: DC | PRN

## 2022-07-05 MED ORDER — DIPHENHYDRAMINE HCL 25 MG PO CAPS: 25.0000 mg | ORAL_CAPSULE | Freq: Four times a day (QID) | ORAL | Status: DC | PRN

## 2022-07-05 MED ORDER — ONDANSETRON HCL 4 MG PO TABS: 4.0000 mg | ORAL_TABLET | ORAL | Status: DC | PRN

## 2022-07-05 MED ORDER — OXYTOCIN-SODIUM CHLORIDE 30-0.9 UT/500ML-% IV SOLN
1.0000 m[IU]/min | INTRAVENOUS | Status: DC
  Administered 2022-07-05: 2 m[IU]/min via INTRAVENOUS
  Filled 2022-07-05: qty 500

## 2022-07-05 MED ORDER — COCONUT OIL OIL: 1.0000 | TOPICAL_OIL | Status: DC | PRN

## 2022-07-05 MED ORDER — METHYLERGONOVINE MALEATE 0.2 MG/ML IJ SOLN: 0.2000 mg | INTRAMUSCULAR | Status: DC | PRN

## 2022-07-05 MED ORDER — LACTATED RINGERS IV SOLN: 500.0000 mL | INTRAVENOUS | Status: DC | PRN

## 2022-07-05 MED ORDER — SIMETHICONE 80 MG PO CHEW: 80.0000 mg | CHEWABLE_TABLET | ORAL | Status: DC | PRN

## 2022-07-05 MED ORDER — LIDOCAINE HCL (PF) 1 % IJ SOLN
INTRAMUSCULAR | Status: DC | PRN
  Administered 2022-07-05: 11 mL via EPIDURAL

## 2022-07-05 MED ORDER — ACETAMINOPHEN 325 MG PO TABS
650.0000 mg | ORAL_TABLET | ORAL | Status: DC | PRN
  Administered 2022-07-06 (×2): 650 mg via ORAL
  Filled 2022-07-05 (×2): qty 2

## 2022-07-05 MED ORDER — EPHEDRINE 5 MG/ML INJ: 10.0000 mg | INTRAVENOUS | Status: DC | PRN

## 2022-07-05 MED ORDER — OXYCODONE-ACETAMINOPHEN 5-325 MG PO TABS: 1.0000 | ORAL_TABLET | ORAL | Status: DC | PRN

## 2022-07-05 MED ORDER — BENZOCAINE-MENTHOL 20-0.5 % EX AERO
1.0000 | INHALATION_SPRAY | CUTANEOUS | Status: DC | PRN
  Administered 2022-07-07: 1 via TOPICAL
  Filled 2022-07-05 (×2): qty 56

## 2022-07-05 MED ORDER — ONDANSETRON HCL 4 MG/2ML IJ SOLN: 4.0000 mg | INTRAMUSCULAR | Status: DC | PRN

## 2022-07-05 MED ORDER — ACETAMINOPHEN 325 MG PO TABS: 650.0000 mg | ORAL_TABLET | ORAL | Status: DC | PRN

## 2022-07-05 MED ORDER — LIDOCAINE HCL (PF) 1 % IJ SOLN: 30.0000 mL | INTRAMUSCULAR | Status: DC | PRN

## 2022-07-05 MED ORDER — OXYTOCIN BOLUS FROM INFUSION
333.0000 mL | Freq: Once | INTRAVENOUS | Status: AC
  Administered 2022-07-05: 333 mL via INTRAVENOUS

## 2022-07-05 MED ORDER — LACTATED RINGERS IV SOLN: INTRAVENOUS | Status: DC

## 2022-07-05 MED ORDER — OXYCODONE-ACETAMINOPHEN 5-325 MG PO TABS: 2.0000 | ORAL_TABLET | ORAL | Status: DC | PRN

## 2022-07-05 MED ORDER — ZOLPIDEM TARTRATE 5 MG PO TABS: 5.0000 mg | ORAL_TABLET | Freq: Every evening | ORAL | Status: DC | PRN

## 2022-07-05 MED ORDER — TETANUS-DIPHTH-ACELL PERTUSSIS 5-2.5-18.5 LF-MCG/0.5 IM SUSY: 0.5000 mL | PREFILLED_SYRINGE | Freq: Once | INTRAMUSCULAR | Status: DC

## 2022-07-05 MED ORDER — ONDANSETRON HCL 4 MG/2ML IJ SOLN: 4.0000 mg | Freq: Four times a day (QID) | INTRAMUSCULAR | Status: DC | PRN

## 2022-07-05 MED ORDER — OXYTOCIN-SODIUM CHLORIDE 30-0.9 UT/500ML-% IV SOLN
2.5000 [IU]/h | INTRAVENOUS | Status: DC
  Administered 2022-07-05: 2.5 [IU]/h via INTRAVENOUS

## 2022-07-05 MED ORDER — FENTANYL-BUPIVACAINE-NACL 0.5-0.125-0.9 MG/250ML-% EP SOLN
12.0000 mL/h | EPIDURAL | Status: DC | PRN
  Administered 2022-07-05: 12 mL/h via EPIDURAL
  Filled 2022-07-05: qty 250

## 2022-07-05 MED ORDER — OXYCODONE HCL 5 MG PO TABS: 10.0000 mg | ORAL_TABLET | ORAL | Status: DC | PRN

## 2022-07-05 MED ORDER — PRENATAL MULTIVITAMIN CH
1.0000 | ORAL_TABLET | Freq: Every day | ORAL | Status: DC
  Administered 2022-07-06 – 2022-07-07 (×2): 1 via ORAL
  Filled 2022-07-05 (×2): qty 1

## 2022-07-05 MED ORDER — DIBUCAINE (PERIANAL) 1 % EX OINT: 1.0000 | TOPICAL_OINTMENT | CUTANEOUS | Status: DC | PRN

## 2022-07-05 MED ORDER — OXYCODONE HCL 5 MG PO TABS: 5.0000 mg | ORAL_TABLET | ORAL | Status: DC | PRN

## 2022-07-05 NOTE — Anesthesia Procedure Notes (Signed)
Epidural Patient location during procedure: OB Start time: 07/05/2022 3:19 PM End time: 07/05/2022 3:36 PM  Staffing Anesthesiologist: Lynda Rainwater, MD Performed: anesthesiologist   Preanesthetic Checklist Completed: patient identified, IV checked, site marked, risks and benefits discussed, surgical consent, monitors and equipment checked, pre-op evaluation and timeout performed  Epidural Patient position: sitting Prep: ChloraPrep Patient monitoring: heart rate, cardiac monitor, continuous pulse ox and blood pressure Approach: midline Location: L2-L3 Injection technique: LOR saline  Needle:  Needle type: Tuohy  Needle gauge: 17 G Needle length: 9 cm Needle insertion depth: 5 cm Catheter type: closed end flexible Catheter size: 20 Guage Catheter at skin depth: 9 cm Test dose: negative  Assessment Events: blood not aspirated, injection not painful, no injection resistance, no paresthesia and negative IV test  Additional Notes Reason for block:procedure for pain

## 2022-07-05 NOTE — Lactation Note (Signed)
This note was copied from a baby's chart. Lactation Consultation Note  Patient Name: Mackenzie Schroeder [id number] Date: 07/05/2022 Reason for consult: Mother's request;L&D Initial assessment;Term Age:27 hours Birth Parent latched infant on her right breast using the cradle hold, infant BF for 10 minutes, infant came off the breast and was still cuing to BF. LC assisted Birth Parent with latching infant on her left breast using the cross cradle hold, infant sustained latch and was still BF after 16 minutes when LC left the room.  Birth Parent may benefit from hand pump to pre-pump breast prior to latching infant. Birth Parent will continue to BF infant according to hunger cues, on demand, 8 to 12+ times within 24 hours, STS. Birth Parent knows to ask RN/LC on MBU for further latch assistance if needed.  Maternal Data    Feeding Mother's Current Feeding Choice: Breast Milk  LATCH Score Latch: Grasps breast easily, tongue down, lips flanged, rhythmical sucking.  Audible Swallowing: A few with stimulation  Type of Nipple: Everted at rest and after stimulation (short shafted Birth Parent taught how do reverse pressure softening which extended NS out more.)  Comfort (Breast/Nipple): Soft / non-tender  Hold (Positioning): Assistance needed to correctly position infant at breast and maintain latch.  LATCH Score: 8   Lactation Tools Discussed/Used    Interventions Interventions: Assisted with latch;Skin to skin;Reverse pressure;Breast compression;Adjust position;Support pillows;Position options;Education  Discharge    Consult Status Consult Status: Follow-up from L&D    Vicente Serene 07/05/2022, 7:46 PM

## 2022-07-05 NOTE — H&P (Signed)
Mackenzie Schroeder is a 26 y.o. female, G1P0, EGA 39+ weeks with [facility] 10-28 presenting for elective induction.  PNC complicated by mild gestational thrombocytopenia, marginal cord insertion with normal growth.  OB History     Gravida  1   Para  0   Term  0   Preterm  0   AB  0   Living  0      SAB  0   IAB  0   Ectopic  0   Multiple  0   Live Births             Past Medical History:  Diagnosis Date   Abnormal Pap smear of cervix    ADD (attention deficit disorder)    no meds currently   Anxiety    History of mononucleosis 08/2012   Kidney stones    Past Surgical History:  Procedure Laterality Date   COLPOSCOPY N/A 03/05/2018   Procedure: COLPOSCOPY;  Surgeon: Salvadore Dom, MD;  Location: [facility];  Service: Gynecology;  Laterality: N/A;   LEEP N/A 03/05/2018   Procedure: LOOP ELECTROSURGICAL EXCISION PROCEDURE (LEEP) with ECC;  Surgeon: Salvadore Dom, MD;  Location: [facility];  Service: Gynecology;  Laterality: N/A;  with ECC   WISDOM TOOTH EXTRACTION     Family History: family history includes Graves' disease in her mother; Heart disease in her maternal grandfather; Multiple myeloma in her maternal grandmother. Social History:  reports that she has never smoked. She has never used smokeless tobacco. She reports current alcohol use of about 3.0 - 5.0 standard drinks of alcohol per week. She reports that she does not use drugs.     Maternal Diabetes: No Genetic Screening: Normal Maternal Ultrasounds/Referrals: Normal Fetal Ultrasounds or other Referrals:  None Maternal Substance Abuse:  No Significant Maternal Medications:  None Significant Maternal Lab Results:  Group B Strep negative Number of Prenatal Visits:greater than 3 verified prenatal visits Other Comments:  None  Review of Systems  Respiratory: Negative.    Cardiovascular: Negative.    Maternal Medical History:  Fetal activity: Perceived fetal activity is normal.   Prenatal  complications: no prenatal complications Prenatal Complications - Diabetes: none.   Dilation: 3.5 Effacement (%): 80 Station: 0 Exam by:: Nicanor Bake, RN Blood pressure 129/85, pulse 90, temperature 98.3 F (36.8 C), temperature source Oral, resp. rate 16, height _0  (1.575 m), weight 75.3 kg, last menstrual period 10/01/2021. Maternal Exam:  Uterine Assessment: Contraction strength is mild.  Contraction frequency is rare and irregular.  Abdomen: Patient reports no abdominal tenderness. Estimated fetal weight is 7.5 lbs.   Fetal presentation: vertex Introitus: Normal vulva. Normal vagina.  Amniotic fluid character: not assessed. Pelvis: adequate for delivery.     Fetal Exam Fetal Monitor Review: Mode: ultrasound.   Baseline rate: 130.  Variability: moderate (6-25 bpm).   Pattern: accelerations present and no decelerations.   Fetal State Assessment: Category I - tracings are normal.   Physical Exam Vitals reviewed.  Constitutional:      Appearance: Normal appearance.  Cardiovascular:     Rate and Rhythm: Normal rate and regular rhythm.  Pulmonary:     Effort: Pulmonary effort is normal. No respiratory distress.  Abdominal:     Palpations: Abdomen is soft.  Genitourinary:    General: Normal vulva.  Neurological:     Mental Status: She is alert.     Prenatal labs: ABO, Rh: --/--/PENDING (10/25 1007) O pos Antibody: PENDING (10/25 1007) neg Rubella: Immune (  03/28 0000) RPR: Nonreactive (03/28 0000)  HBsAg: Negative (03/28 0000)  HIV: Non-reactive (03/28 0000)  GBS: Negative/-- (10/04 0000)   Assessment/Plan: IUP at 39+ weeks, mild gestational thrombocytopenia, for elective induction.  Started on pitocin, will monitor progress, anticipate SVD   Blane Ohara Derk Doubek 07/05/2022, 11:08 AM

## 2022-07-05 NOTE — Anesthesia Preprocedure Evaluation (Signed)
Anesthesia Evaluation  Patient identified by MRN, date of birth, ID band Patient awake    Reviewed: Allergy & Precautions, NPO status , Patient's Chart, lab work & pertinent test results  Airway Mallampati: II  TM Distance: >3 FB Neck ROM: Full    Dental no notable dental hx.    Pulmonary neg pulmonary ROS,    Pulmonary exam normal breath sounds clear to auscultation       Cardiovascular negative cardio ROS Normal cardiovascular exam Rhythm:Regular Rate:Normal     Neuro/Psych negative neurological ROS  negative psych ROS   GI/Hepatic negative GI ROS, Neg liver ROS,   Endo/Other  negative endocrine ROS  Renal/GU negative Renal ROS  negative genitourinary   Musculoskeletal negative musculoskeletal ROS (+)   Abdominal   Peds negative pediatric ROS (+)  Hematology negative hematology ROS (+)   Anesthesia Other Findings   Reproductive/Obstetrics (+) Pregnancy                             Anesthesia Physical  Anesthesia Plan  ASA: 2  Anesthesia Plan: Epidural   Post-op Pain Management:    Induction:   PONV Risk Score and Plan: 3 and Treatment may vary due to age or medical condition  Airway Management Planned: Natural Airway  Additional Equipment:   Intra-op Plan:   Post-operative Plan:   Informed Consent: I have reviewed the patients History and Physical, chart, labs and discussed the procedure including the risks, benefits and alternatives for the proposed anesthesia with the patient or authorized representative who has indicated his/her understanding and acceptance.       Plan Discussed with:   Anesthesia Plan Comments:         Anesthesia Quick Evaluation

## 2022-07-05 NOTE — Progress Notes (Signed)
Feeling some ctx Afeb, VSS FHT-110-120, Cat I, irreg ctx VE-4/90/0, vtx, AROM clear Will continue pitocin, monitor progress, anticipate SVD

## 2022-07-06 NOTE — Progress Notes (Signed)
MOB was referred for history of depression/anxiety. * Referral screened out by Clinical Social Worker because none of the following criteria appear to apply: ~ History of anxiety/depression during this pregnancy, or of post-partum depression following prior delivery. ~ Diagnosis of anxiety and/or depression within last 3 years. No concerns noted in OB records.  Per chart symptoms has been well controlled. MOB stopped Celexa in 2021.  OR * MOB's symptoms currently being treated with medication and/or therapy.  Please contact the Clinical Social Worker if needs arise, by MOB request, or if MOB scores greater than 9/yes to question 10 on Edinburgh Postpartum Depression Screen.   Kush Farabee Boyd-Gilyard, MSW, LCSW Clinical Social Work [phone] 

## 2022-07-06 NOTE — Progress Notes (Signed)
Vitals:   07/06/22 0539 07/06/22 0952 07/06/22 1118 07/06/22 1335  BP: 125/89 (!) 138/94 130/84 123/78  Pulse: 70 94 82 72  Resp: 16 18  18   Temp: 98.6 F (37 C) 98.2 F (36.8 C)  98.5 F (36.9 C)  TempSrc: Oral Oral  Oral  SpO2: 97% 99%    Weight:      Height:        Only one BP abnormal.- continue obs.

## 2022-07-06 NOTE — Lactation Note (Signed)
This note was copied from a baby's chart. Lactation Consultation Note  Patient Name: Mackenzie Schroeder ZDGLO'V Date: 07/06/2022 Reason for consult: Initial assessment;Primapara;Term;Nipple pain/trauma Age:27 hours  LC in to visit with P1 Mom and FOB of term baby.  Mom concerned as baby has been sleepy since being circumcised.  Baby fed consistently last night.  Mom does have a bruise on her left areola.    Reviewed breast massage and hand expression.  Mom needing guidance and colostrum flow noted.  Assisted with cross cradle hold, assisting with a U hold on the breast and firm hold on baby's head ear to ear.  Baby able to sustain a deep latch and suck with deep jaw extensions and occasional swallow.  Baby did come off a few times, but was able to latch back on.  Showed FOB how to assess flange of lower lip.  Showed him the chin tug.  Mom using breast compression to increase milk flow when baby was sucking.  Encouraged STS and watching baby for feeding cues.  Reviewed normal  newborn sleepiness and spoon feeding colostrum if baby is too sleepy to latch to breast and feed within 3 hrs.  Encouraged calling for assistance prn.  Maternal Data Has patient been taught Hand Expression?: Yes Does the patient have breastfeeding experience prior to this delivery?: No  Feeding Mother's Current Feeding Choice: Breast Milk  LATCH Score Latch: Repeated attempts needed to sustain latch, nipple held in mouth throughout feeding, stimulation needed to elicit sucking reflex.  Audible Swallowing: A few with stimulation  Type of Nipple: Everted at rest and after stimulation  Comfort (Breast/Nipple): Filling, red/small blisters or bruises, mild/mod discomfort  Hold (Positioning): Assistance needed to correctly position infant at breast and maintain latch.  LATCH Score: 6  Interventions Interventions: Breast feeding basics reviewed;Assisted with latch;Skin to skin;Breast massage;Hand express;Breast  compression;Adjust position;Support pillows;Position options;Expressed milk;LC Services brochure  Discharge Pump: Personal;DEBP (Spectra DEBP) WIC Program: No  Consult Status Consult Status: Follow-up Date: 07/07/22 Follow-up type: In-patient    Broadus John 07/06/2022, 12:18 PM

## 2022-07-06 NOTE — Anesthesia Postprocedure Evaluation (Signed)
Anesthesia Post Note  Patient: Mackenzie Schroeder  Procedure(s) Performed: AN AD [place]     Patient location during evaluation: Mother Baby Anesthesia Type: Epidural Level of consciousness: awake and alert Pain management: pain level controlled Vital Signs Assessment: post-procedure vital signs reviewed and stable Respiratory status: spontaneous breathing, nonlabored ventilation and respiratory function stable Cardiovascular status: stable Postop Assessment: no headache, no backache and epidural receding Anesthetic complications: no   No notable events documented.  Last Vitals:  Vitals:   07/06/22 0205 07/06/22 0539  BP: 114/69 125/89  Pulse: 73 70  Resp: 17 16  Temp: 36.8 C 37 C  SpO2: 97% 97%    Last Pain:  Vitals:   07/06/22 0715  TempSrc:   PainSc: 0-No pain   Pain Goal: Patients Stated Pain Goal: 0 (07/05/22 2256)                 Martyna Thorns

## 2022-07-06 NOTE — Lactation Note (Signed)
This note was copied from a baby's chart. Lactation Consultation Note  Patient Name: Mackenzie Schroeder [id number] Date: 07/06/2022   Age:27 hours  LC attempted twice to consult with Mom.  RN in room and baby was just circ'd and Mom in bathroom.  RN to encourage STS with baby and LC will return at a later time.  Broadus John 07/06/2022, 10:03 AM

## 2022-07-06 NOTE — Progress Notes (Signed)
Patient is eating, ambulating, voiding.  Pain control is good.  Vitals:   07/05/22 2050 07/05/22 2150 07/06/22 0205 07/06/22 0539  BP: 127/87 119/70 114/69 125/89  Pulse: 86 69 73 70  Resp: 17 18 17 16   Temp: 98 F (36.7 C) 98.2 F (36.8 C) 98.2 F (36.8 C) 98.6 F (37 C)  TempSrc: Oral Oral Oral Oral  SpO2: 98% 94% 97% 97%  Weight:      Height:        Fundus firm Perineum without swelling.  Lab Results  Component Value Date   WBC 13.1 (H) 07/05/2022   HGB 11.0 (L) 07/05/2022   HCT 31.8 (L) 07/05/2022   MCV 92.2 07/05/2022   PLT 121 (L) 07/05/2022    --/--/O POS (10/25 1007)/RI  A/P Post partum day 1.  Routine care.  Expect d/c routine.   Parents desires circumsision.  All risks, benefits and alternatives discussed with the mother.   Mackenzie Schroeder

## 2022-07-07 NOTE — Discharge Summary (Signed)
Postpartum Discharge Summary  Date of Service updated 07/07/22     Patient Name: Mackenzie Schroeder DOB: 28-Nov-1995 MRN: [id number]  Date of admission: 07/05/2022 Delivery date:07/05/2022  Delivering provider: Willis Modena, TODD  Date of discharge: 07/07/2022  Admitting diagnosis: Indication for care in labor or delivery [O75.9] Intrauterine pregnancy: [redacted]w[redacted]d     Secondary diagnosis:  Principal Problem:   Indication for care in labor or delivery  Additional problems: None  Discharge diagnosis: Term Pregnancy Delivered                                              Post partum procedures: n/a Augmentation: AROM and Pitocin Complications: None  Hospital course: Induction of Labor With Vaginal Delivery   26 y.o. yo G1P1001 at [redacted]w[redacted]d was admitted to the hospital 07/05/2022 for induction of labor.  Indication for induction: Favorable cervix at term.  Patient had an labor course complicated by n/a Membrane Rupture Time/Date: 2:56 PM ,07/05/2022   Delivery Method:Vaginal, Spontaneous  Episiotomy: None  Lacerations:  2nd degree;Perineal  Details of delivery can be found in separate delivery note.  Patient had a postpartum course complicated by n/a. Patient is discharged home 07/07/22.  Newborn Data: Birth date:07/05/2022  Birth time:6:30 PM  Gender:Female  Living status:Living  Apgars:9 ,9  Weight:3175 g     Physical exam  Vitals:   07/06/22 1118 07/06/22 1335 07/07/22 0247 07/07/22 0519  BP: 130/84 123/78 (!) 132/90 124/82  Pulse: 82 72 72 70  Resp:  18 18 18   Temp:  98.5 F (36.9 C) 98.4 F (36.9 C) 98.6 F (37 C)  TempSrc:  Oral Oral Oral  SpO2:   99% 98%  Weight:      Height:       General: alert, cooperative, and no distress Lochia: appropriate Uterine Fundus: firm Incision: N/A DVT Evaluation: No evidence of DVT seen on physical exam. Labs: Lab Results  Component Value Date   WBC 13.1 (H) 07/05/2022   HGB 11.0 (L) 07/05/2022   HCT 31.8 (L) 07/05/2022    MCV 92.2 07/05/2022   PLT 121 (L) 07/05/2022      Latest Ref Rng & Units 03/09/2021    2:19 PM  CMP  Glucose 65 - 99 mg/dL 92   BUN 7 - 25 mg/dL 11   Creatinine 0.50 - 1.10 mg/dL 0.86   Sodium 135 - 146 mmol/L 136   Potassium 3.5 - 5.3 mmol/L 4.0   Chloride 98 - 110 mmol/L 104   CO2 20 - 32 mmol/L 24   Calcium 8.6 - 10.2 mg/dL 9.0   Total Protein 6.1 - 8.1 g/dL 6.7   Total Bilirubin 0.2 - 1.2 mg/dL 0.3   AST 10 - 30 U/L 18   ALT 6 - 29 U/L 15    Edinburgh Score:    07/06/2022   11:21 AM  Edinburgh Postnatal Depression Scale Screening Tool  I have been able to laugh and see the funny side of things. 0  I have looked forward with enjoyment to things. 0  I have blamed myself unnecessarily when things went wrong. 0  I have been anxious or worried for no good reason. 2  I have felt scared or panicky for no good reason. 0  Things have been getting on top of me. 0  I have been so unhappy that I have had  difficulty sleeping. 0  I have felt sad or miserable. 0  I have been so unhappy that I have been crying. 0  The thought of harming myself has occurred to me. 0  Edinburgh Postnatal Depression Scale Total 2      After visit meds:  Allergies as of 07/07/2022   No Known Allergies      Medication List     TAKE these medications    multivitamin-prenatal 27-0.8 MG Tabs tablet Take 1 tablet by mouth daily at 12 noon.   pantoprazole 40 MG tablet Commonly known as: PROTONIX Take 40 mg by mouth daily.         Discharge home in stable condition Infant Feeding: Breast Infant Disposition:home with mother Discharge instruction: per After Visit Summary and Postpartum booklet. Activity: Advance as tolerated. Pelvic rest for 6 weeks.  Diet: routine diet Anticipated Birth Control: Unsure Postpartum Appointment:6 weeks Additional Postpartum F/U:  n/a Future Appointments:No future appointments. Follow up Visit:  Follow-up Information     Meisinger, Todd, MD. Schedule an  appointment as soon as possible for a visit in 6 week(s).   Specialty: Obstetrics and Gynecology Contact information: [address] [place] [address] [phone]                     07/07/2022 [facility] Lars Masson, MD

## 2022-07-07 NOTE — Progress Notes (Signed)
Patient is doing well.  She is ambulating, voiding, tolerating PO.  Pain control is good.  Lochia is appropriate  Vitals:   07/06/22 1118 07/06/22 1335 07/07/22 0247 07/07/22 0519  BP: 130/84 123/78 (!) 132/90 124/82  Pulse: 82 72 72 70  Resp:  18 18 18   Temp:  98.5 F (36.9 C) 98.4 F (36.9 C) 98.6 F (37 C)  TempSrc:  Oral Oral Oral  SpO2:   99% 98%  Weight:      Height:        NAD Fundus firm Ext: no edema  Lab Results  Component Value Date   WBC 13.1 (H) 07/05/2022   HGB 11.0 (L) 07/05/2022   HCT 31.8 (L) 07/05/2022   MCV 92.2 07/05/2022   PLT 121 (L) 07/05/2022    --/--/O POS (10/25 1007)  A/P 26 y.o. G1P1001 PPD#2. Routine care.   Meeting all goals.  Discharge to home today.   [place]

## 2022-07-07 NOTE — Lactation Note (Signed)
This note was copied from a baby's chart. Lactation Consultation Note  Patient Name: Boy Coleen Cardiff HFWYO'V Date: 07/07/2022 Reason for consult: Follow-up assessment Age:27 hours   P1: Term infant at 39+4 weeks Feeding preference: Breast  Birth parent was breast feeding "[place]" when I arrived.  Baby was latched well; wide gape and good jaw extensions.  Birth parent denied pain with latching and has stated that feeding has improved.  Encouraged to continue feeding on cue and at least 8-12 times/24 hours; discussed waking during the night every three to four hours if he does not self awaken.  Family has our op phone number for any concerns after discharge.  Support person at bedside.  RN updated; family ready for discharge.   Maternal Data    Feeding    LATCH Score                    Lactation Tools Discussed/Used    Interventions    Discharge    Consult Status Consult Status: Complete Date: 07/07/22 Follow-up type: Call as needed    Shelagh Rayman R Reida Hem 07/07/2022, 11:21 AM

## 2022-07-17 ENCOUNTER — Telehealth (HOSPITAL_COMMUNITY): Payer: Self-pay | Admitting: *Deleted

## 2022-07-17 NOTE — Telephone Encounter (Signed)
Left phone voicemail message.  Odis Hollingshead, RN 07-17-2022 at 9:15am

## 2023-06-08 ENCOUNTER — Other Ambulatory Visit: Payer: Self-pay | Admitting: Obstetrics and Gynecology

## 2023-06-08 DIAGNOSIS — N644 Mastodynia: Secondary | ICD-10-CM

## 2023-06-18 ENCOUNTER — Ambulatory Visit
Admission: RE | Admit: 2023-06-18 | Discharge: 2023-06-18 | Disposition: A | Discharge status: Home / Self Care | Payer: BC Managed Care – PPO | Source: Ambulatory Visit | Attending: Obstetrics and Gynecology | Admitting: Obstetrics and Gynecology

## 2023-06-18 DIAGNOSIS — N644 Mastodynia: Secondary | ICD-10-CM

## 2024-06-18 ENCOUNTER — Encounter: Payer: Self-pay | Admitting: Neurology

## 2024-10-15 ENCOUNTER — Ambulatory Visit: Admitting: Neurology

## 2024-10-15 ENCOUNTER — Encounter: Payer: Self-pay | Admitting: Neurology

## 2024-10-15 VITALS — BP 116/59 | HR 79 | Ht 63.0 in | Wt 126.4 lb

## 2024-10-15 DIAGNOSIS — G43009 Migraine without aura, not intractable, without status migrainosus: Secondary | ICD-10-CM | POA: Diagnosis not present

## 2024-10-15 MED ORDER — ONDANSETRON 4 MG PO TBDP: 4.0000 mg | ORAL_TABLET | Freq: Three times a day (TID) | ORAL | 5 refills | Status: AC | PRN

## 2024-10-15 MED ORDER — RIZATRIPTAN BENZOATE 10 MG PO TBDP: 10.0000 mg | ORAL_TABLET | ORAL | 5 refills | Status: AC | PRN

## 2024-10-15 NOTE — Patient Instructions (Signed)
" °  Take RIZATRIPTAN -MLT 10MG  at earliest onset of headache.  May repeat dose once in 2 hours if needed.  Maximum 2 tablets in 24 hours. Take ONDANSETRON  ODT for nausea Limit use of pain relievers to no more than 9 days out of the month.  These medications include acetaminophen , NSAIDs (ibuprofen /Advil /Motrin , naproxen /Aleve , triptans (Imitrex/sumatriptan), Excedrin, and narcotics.  This will help reduce risk of rebound headaches. Be aware of common food triggers Routine exercise Stay adequately hydrated (aim for 64 oz water daily) Keep headache diary Maintain proper stress management Maintain proper sleep hygiene Do not skip meals Consider supplements:  magnesium  citrate 400mg  daily, riboflavin 400mg  daily, coenzyme Q10 100mg  three times daily OR MigreLief combination pill twice daily.  "

## 2025-05-04 ENCOUNTER — Ambulatory Visit: Payer: Self-pay | Admitting: Neurology
